# Patient Record
Sex: Female | Born: 1960 | Race: White | Hispanic: No | Marital: Married | Smoking: Never smoker
Health system: Southern US, Community
[De-identification: ages and names within clinical notes are randomized; demographics above are authoritative.]

## PROBLEM LIST (undated history)

## (undated) DIAGNOSIS — Z800 Family history of malignant neoplasm of digestive organs: Secondary | ICD-10-CM

## (undated) DIAGNOSIS — R112 Nausea with vomiting, unspecified: Secondary | ICD-10-CM

## (undated) DIAGNOSIS — N189 Chronic kidney disease, unspecified: Secondary | ICD-10-CM

## (undated) DIAGNOSIS — IMO0002 Reserved for concepts with insufficient information to code with codable children: Secondary | ICD-10-CM

## (undated) DIAGNOSIS — Z87442 Personal history of urinary calculi: Secondary | ICD-10-CM

## (undated) DIAGNOSIS — R87619 Unspecified abnormal cytological findings in specimens from cervix uteri: Secondary | ICD-10-CM

## (undated) DIAGNOSIS — Z8052 Family history of malignant neoplasm of bladder: Secondary | ICD-10-CM

## (undated) DIAGNOSIS — N952 Postmenopausal atrophic vaginitis: Secondary | ICD-10-CM

## (undated) DIAGNOSIS — Z9889 Other specified postprocedural states: Secondary | ICD-10-CM

## (undated) DIAGNOSIS — Z8041 Family history of malignant neoplasm of ovary: Secondary | ICD-10-CM

## (undated) DIAGNOSIS — M8580 Other specified disorders of bone density and structure, unspecified site: Secondary | ICD-10-CM

## (undated) DIAGNOSIS — Z803 Family history of malignant neoplasm of breast: Secondary | ICD-10-CM

## (undated) HISTORY — DX: Family history of malignant neoplasm of digestive organs: Z80.0

## (undated) HISTORY — DX: Postmenopausal atrophic vaginitis: N95.2

## (undated) HISTORY — PX: DIAGNOSTIC LAPAROSCOPY: SUR761

## (undated) HISTORY — DX: Personal history of urinary calculi: Z87.442

## (undated) HISTORY — DX: Family history of malignant neoplasm of breast: Z80.3

## (undated) HISTORY — DX: Reserved for concepts with insufficient information to code with codable children: IMO0002

## (undated) HISTORY — PX: OTHER SURGICAL HISTORY: SHX169

## (undated) HISTORY — DX: Unspecified abnormal cytological findings in specimens from cervix uteri: R87.619

## (undated) HISTORY — DX: Family history of malignant neoplasm of ovary: Z80.41

## (undated) HISTORY — PX: BREAST BIOPSY: SHX20

## (undated) HISTORY — DX: Family history of malignant neoplasm of bladder: Z80.52

## (undated) HISTORY — DX: Other specified disorders of bone density and structure, unspecified site: M85.80

---

## 1999-05-09 ENCOUNTER — Other Ambulatory Visit: Admission: RE | Admit: 1999-05-09 | Discharge: 1999-05-09 | Payer: Self-pay | Admitting: Obstetrics and Gynecology

## 1999-12-23 ENCOUNTER — Ambulatory Visit (HOSPITAL_COMMUNITY): Admission: RE | Admit: 1999-12-23 | Discharge: 1999-12-23 | Payer: Self-pay | Admitting: Urology

## 1999-12-23 ENCOUNTER — Encounter: Payer: Self-pay | Admitting: Urology

## 2000-05-25 ENCOUNTER — Encounter: Admission: RE | Admit: 2000-05-25 | Discharge: 2000-05-25 | Payer: Self-pay | Admitting: Urology

## 2000-05-25 ENCOUNTER — Encounter: Payer: Self-pay | Admitting: Urology

## 2000-12-30 ENCOUNTER — Other Ambulatory Visit: Admission: RE | Admit: 2000-12-30 | Discharge: 2000-12-30 | Payer: Self-pay | Admitting: Obstetrics and Gynecology

## 2003-01-03 ENCOUNTER — Other Ambulatory Visit: Admission: RE | Admit: 2003-01-03 | Discharge: 2003-01-03 | Payer: Self-pay | Admitting: Obstetrics and Gynecology

## 2003-07-17 ENCOUNTER — Other Ambulatory Visit: Admission: RE | Admit: 2003-07-17 | Discharge: 2003-07-17 | Payer: Self-pay | Admitting: Obstetrics and Gynecology

## 2004-02-01 ENCOUNTER — Other Ambulatory Visit: Admission: RE | Admit: 2004-02-01 | Discharge: 2004-02-01 | Payer: Self-pay | Admitting: Obstetrics and Gynecology

## 2004-08-15 ENCOUNTER — Other Ambulatory Visit: Admission: RE | Admit: 2004-08-15 | Discharge: 2004-08-15 | Payer: Self-pay | Admitting: Obstetrics and Gynecology

## 2004-11-15 ENCOUNTER — Encounter: Admission: RE | Admit: 2004-11-15 | Discharge: 2004-11-15 | Payer: Self-pay | Admitting: Obstetrics and Gynecology

## 2005-02-12 ENCOUNTER — Other Ambulatory Visit: Admission: RE | Admit: 2005-02-12 | Discharge: 2005-02-12 | Payer: Self-pay | Admitting: Obstetrics and Gynecology

## 2005-05-15 ENCOUNTER — Encounter: Admission: RE | Admit: 2005-05-15 | Discharge: 2005-05-15 | Payer: Self-pay | Admitting: Obstetrics and Gynecology

## 2005-11-11 ENCOUNTER — Encounter: Admission: RE | Admit: 2005-11-11 | Discharge: 2005-11-11 | Payer: Self-pay | Admitting: Obstetrics and Gynecology

## 2006-11-18 ENCOUNTER — Encounter: Admission: RE | Admit: 2006-11-18 | Discharge: 2006-11-18 | Payer: Self-pay | Admitting: Obstetrics and Gynecology

## 2007-04-21 ENCOUNTER — Ambulatory Visit: Payer: Self-pay | Admitting: Obstetrics & Gynecology

## 2007-04-21 ENCOUNTER — Encounter: Payer: Self-pay | Admitting: Obstetrics & Gynecology

## 2007-04-21 ENCOUNTER — Encounter: Admission: RE | Admit: 2007-04-21 | Discharge: 2007-04-21 | Payer: Self-pay | Admitting: Obstetrics & Gynecology

## 2007-06-02 ENCOUNTER — Ambulatory Visit: Payer: Self-pay | Admitting: Obstetrics & Gynecology

## 2007-11-22 ENCOUNTER — Encounter: Admission: RE | Admit: 2007-11-22 | Discharge: 2007-11-22 | Payer: Self-pay | Admitting: Obstetrics & Gynecology

## 2008-03-03 HISTORY — PX: OTHER SURGICAL HISTORY: SHX169

## 2008-05-24 ENCOUNTER — Ambulatory Visit: Payer: Self-pay | Admitting: Obstetrics & Gynecology

## 2008-05-24 ENCOUNTER — Encounter: Payer: Self-pay | Admitting: Obstetrics & Gynecology

## 2008-05-24 ENCOUNTER — Other Ambulatory Visit: Admission: RE | Admit: 2008-05-24 | Discharge: 2008-05-24 | Payer: Self-pay | Admitting: Obstetrics & Gynecology

## 2008-05-24 LAB — CONVERTED CEMR LAB
Hemoglobin: 15.1 g/dL — ABNORMAL HIGH (ref 12.0–15.0)
MCV: 90.5 fL (ref 78.0–100.0)
Platelets: 233 10*3/uL (ref 150–400)
RBC: 4.93 M/uL (ref 3.87–5.11)
Total CHOL/HDL Ratio: 2.5
Triglycerides: 64 mg/dL (ref <150)
WBC: 4.3 10*3/uL (ref 4.0–10.5)

## 2008-07-06 ENCOUNTER — Encounter: Admission: RE | Admit: 2008-07-06 | Discharge: 2008-07-06 | Payer: Self-pay | Admitting: Obstetrics & Gynecology

## 2008-07-12 ENCOUNTER — Ambulatory Visit: Payer: Self-pay | Admitting: Obstetrics & Gynecology

## 2008-07-13 ENCOUNTER — Encounter: Payer: Self-pay | Admitting: Obstetrics & Gynecology

## 2008-09-18 ENCOUNTER — Ambulatory Visit (HOSPITAL_COMMUNITY): Admission: RE | Admit: 2008-09-18 | Discharge: 2008-09-20 | Payer: Self-pay | Admitting: Urology

## 2008-09-21 ENCOUNTER — Observation Stay (HOSPITAL_COMMUNITY): Admission: RE | Admit: 2008-09-21 | Discharge: 2008-09-22 | Payer: Self-pay | Admitting: Urology

## 2008-11-27 ENCOUNTER — Encounter: Admission: RE | Admit: 2008-11-27 | Discharge: 2008-11-27 | Payer: Self-pay | Admitting: Obstetrics & Gynecology

## 2009-05-07 ENCOUNTER — Encounter: Admission: RE | Admit: 2009-05-07 | Discharge: 2009-05-07 | Payer: Self-pay | Admitting: Internal Medicine

## 2009-05-23 ENCOUNTER — Encounter: Admission: RE | Admit: 2009-05-23 | Discharge: 2009-05-23 | Payer: Self-pay | Admitting: Family Medicine

## 2009-06-11 LAB — HM PAP SMEAR

## 2009-06-20 ENCOUNTER — Ambulatory Visit: Payer: Self-pay | Admitting: Obstetrics & Gynecology

## 2009-06-20 LAB — CONVERTED CEMR LAB
Cholesterol: 211 mg/dL — ABNORMAL HIGH (ref 0–200)
HDL: 87 mg/dL (ref >39)

## 2009-12-04 ENCOUNTER — Encounter: Admission: RE | Admit: 2009-12-04 | Discharge: 2009-12-04 | Payer: Self-pay | Admitting: Obstetrics & Gynecology

## 2009-12-04 LAB — HM MAMMOGRAPHY: HM Mammogram: NORMAL

## 2010-03-25 ENCOUNTER — Encounter: Payer: Self-pay | Admitting: Obstetrics and Gynecology

## 2010-06-09 LAB — TYPE AND SCREEN
ABO/RH(D): O POS
Antibody Screen: NEGATIVE

## 2010-06-09 LAB — CBC
HCT: 34.8 % — ABNORMAL LOW (ref 36.0–46.0)
HCT: 38.3 % (ref 36.0–46.0)
Hemoglobin: 11.8 g/dL — ABNORMAL LOW (ref 12.0–15.0)
Hemoglobin: 12.9 g/dL (ref 12.0–15.0)
MCHC: 33.7 g/dL (ref 30.0–36.0)
MCHC: 33.8 g/dL (ref 30.0–36.0)
MCHC: 34.2 g/dL (ref 30.0–36.0)
MCV: 89.8 fL (ref 78.0–100.0)
MCV: 90.5 fL (ref 78.0–100.0)
Platelets: 187 10*3/uL (ref 150–400)
Platelets: 219 10*3/uL (ref 150–400)
Platelets: 223 10*3/uL (ref 150–400)
Platelets: 250 10*3/uL (ref 150–400)
RBC: 3.85 MIL/uL — ABNORMAL LOW (ref 3.87–5.11)
RBC: 4.27 MIL/uL (ref 3.87–5.11)
RBC: 4.92 MIL/uL (ref 3.87–5.11)
RDW: 12.8 % (ref 11.5–15.5)
RDW: 12.9 % (ref 11.5–15.5)
WBC: 9.5 10*3/uL (ref 4.0–10.5)

## 2010-06-09 LAB — ABO/RH: ABO/RH(D): O POS

## 2010-07-16 NOTE — Assessment & Plan Note (Signed)
NAMESABEEN, [name]                ACCOUNT NO.:  [id number]   MEDICAL RECORD NO.:  [id number]          PATIENT TYPE:  POB   LOCATION:  [facility] [place]         FACILITY:  [facility]   PHYSICIAN:  Elsie Lincoln, MD      DATE OF BIRTH:  [birth year]   DATE OF SERVICE:  06/02/2007                                  CLINIC NOTE   The patient is a 50 year old thin white female who is osteopenic whom we  placed on Fosamax. She is also still having painful intercourse on the  Vagifem. We will change her to Estrace because she is very atrophic and  possibly not absorbing the Vagifem. She has also a history of  endometrial hyperplasia that was treated many years ago by physicians  __________ ; however, since we are placing her on Estrace we will  protect her endometrium with 10 days of Provera per month. The patient  understands this. If she is not better after a month, we will  investigate other reasons for painful intercourse such as vulvodynia.  She wants her cholesterol, glucose, TSH, and CBC drawn today for her  yearly visit. She is fasting for this today. She is set up today for  mammogram. She is also to obtain her Pap smear. We also reviewed calcium  and vitamin D requirements, and she will be taking 1500 mg of calcium a  day through diet and supplements as well as 800 mg of vitamin D. The  patient is to come back in a month to see how she is doing. One sample  of Estrace was also given.           ______________________________  Elsie Lincoln, MD     KL/MEDQ  D:  06/02/2007  T:  06/02/2007  Job:  [id number]

## 2010-07-16 NOTE — Assessment & Plan Note (Signed)
[name], [name]                ACCOUNT NO.:  [id number]   MEDICAL RECORD NO.:  [id number]          PATIENT TYPE:  POB   LOCATION:  [facility] [place]         FACILITY:  [facility]   PHYSICIAN:  Allie Bossier, MD        DATE OF BIRTH:  08-01-1960   DATE OF SERVICE:  07/12/2008                                  CLINIC NOTE   [name] is a 50 year old married white G1, P1, who is here for her  followup of symptomatic vaginal atrophy and untreated osteopenia.  At  her last visit, I discussed multiple options of therapy.  She previously  was on Fosamax, but became scared of possible side effects, quit taking  it.  After discussion, she wanted to try the Prempro 0.625/2.5 mg on a  daily basis.  She has been having several occasions of bleeding for  approximately a week each of the last few months that she has been on  the Prempro and she is not happy with the prospect of continued  bleeding.  Initially, I discussed switching her to [facility], but again  she does not want to have a monthly period; therefore, after discussion,  I have given her prescription and 4 samples of Yaz birth control pills.  She is going to take these on a continuous basis.  She understands that  at some point, we should probably switch her back to a lower dose of  estrogen.  She does not smoke and has no particular risk factor for  taking the pill.  She will come back if there is a problem.  If not, I  will see her in a year for her annual.      Allie Bossier, MD     MCD/MEDQ  D:  07/12/2008  T:  07/12/2008  Job:  [id number]

## 2010-07-16 NOTE — Op Note (Signed)
[name], [name]                ACCOUNT NO.:  [id number]   MEDICAL RECORD NO.:  [id number]          PATIENT TYPE:  OIB   LOCATION:  0098                         FACILITY:  [facility]   PHYSICIAN:  Bertram Millard. Dahlstedt, M.D.DATE OF BIRTH:  August 22, 1960   DATE OF PROCEDURE:  DATE OF DISCHARGE:                               OPERATIVE REPORT   PREOPERATIVE DIAGNOSIS:  Left renal calculi, horseshoe kidney.   POSTOPERATIVE DIAGNOSIS:  Left renal calculi, horseshoe kidney.   PROCEDURE PERFORMED:  Second stage left percutaneous nephrostolithotomy.   SURGEON:  Dr. Marcine Matar.   RESIDENT:  Martin Majestic. Peggye Pitt, MD.   INTERVENTIONAL RADIOLOGY:  Dr. Drucilla Chalet.   ANESTHESIA:  General endotracheal.   SPECIMENS:  Stones sent with the family.   ESTIMATED BLOOD LOSS:  Minimal.   COMPLICATIONS:  None.   DRAINS:  18-French Council tip catheter as nephrostomy tube.   INDICATIONS FOR PROCEDURE:  Ms. [name] is a 50 year old Caucasian female  with a history of a horseshoe kidney.  She had large stone burden on her  left side.  She underwent a first stage percutaneous nephrostolithotomy  2 days ago and now presents for second stage procedure.   DESCRIPTION OF PROCEDURE IN DETAIL:  [name] was brought back to the  operating room, quickly identified via her wristband.  A preoperative  time-out was called to confirm correct patient, procedure and site.  After successful induction of general endotracheal anesthesia, a Foley  catheter was inserted transurethrally.  The patient was then positioned  prone on the operating room table and we took special care to protect  all of her bony prominences to avoid any undue pressure.   Nephrostomy tube as well as her nephroureteral stent were prepped into  the field and as her back was prepped and draped in the usual sterile  fashion surgeons were sterilely gowned and gloved.  Wire was then  advanced down the left nephroureteral catheter and nephroureteral  catheter was removed and passed off the field.  The patient's  nephrostomy tube was removed.  We then advanced the second SuperStiff  wire through a coaxial sheath.  One of these wires was used as a safety  wire, the other wire was used to insert the balloon dilator.  The  balloon dilator was advanced over the wire into appropriate position  under fluoroscopic guidance.  The balloon was then inflated and the  sheath was passed over the balloon, the balloon dilator was removed.   A rigid nephroscopy was performed.  The stone was easily visible.  The  stone was then fragmented into multiple pieces using the LithoClast  device.  Pieces were then retrieved using a grasping device.  The stone  fragments were collected in its entirety, the renal pelvis was closely  examined and all fragmented pieces were retrieved successfully.   A flexible cystoscope was then used to perform flexible nephroscopy.  We  examined the lower pole, interpolar and upper pole calyces.  There was  calix that appeared to have some retained stone material, however this  was likely posteriorly located and we were unable to  access this calix.  The remainder of the collecting system was cleared of any remaining  stone debris.  There was a clot in the renal pelvis that was removed at  the end of the case with a rigid nephroscope.   The sheath was then removed and an 18-French Council tip catheter was  inserted over one of the wires into the renal pelvis.  Nephrostogram was  performed and confirmed accurate positioning within the renal pelvis, 3  mL of sterile water was used to blow up the balloon of the nephrostomy  tube.  The nephrostomy tube was then secured to the skin using 0 silk.  A sterile dressing was applied and the procedure was terminated.   Please note Dr. Marcine Matar was present, available and  participated in all aspects of this patient's operation.   DISPOSITION:  The patient tolerated the procedure  well, was extubated  and transported to the PACU in stable condition.     ______________________________  Martin Majestic Peggye Pitt, M.D.      Bertram Millard. Dahlstedt, M.D.  Electronically Signed    KAR/MEDQ  D:  09/21/2008  T:  09/21/2008  Job:  [id number]

## 2010-07-16 NOTE — Assessment & Plan Note (Signed)
[name], BEG                ACCOUNT NO.:  [id number]   MEDICAL RECORD NO.:  [id number]          PATIENT TYPE:  POB   LOCATION:  [facility] [place]         FACILITY:  [facility]   PHYSICIAN:  Allie Bossier, MD        DATE OF BIRTH:  08/14/1960   DATE OF SERVICE:  06/20/2009                                  CLINIC NOTE   Ms. Lucado is a 50 year old married white gravida 1, para 1.  She also  has an adopted child.  The children live with her included an 27-year-old  daughter and a 64 year old son.  She comes in here for annual exam.  She  has no particular GYN complaints.  She still does have decreased libido.  Her herbalist doctor, Dr. Flonnie Hailstone, has increased her testosterone  and she is hopeful that will help her issue.  She is now on estradiol  0.3 mg of cream daily, but she is getting ready to lower the dose to 0.1  mg.  She is also on progesterone.   PAST MEDICAL HISTORY:  She has a history of simple hyperplasia in 2004,  menopause at 50 years of age, a history of low-grade dysplasia, but most  recently her Pap smear was normal in March 2010, borderline  hypertension, mildly elevated cholesterol, osteopenia, and a history of  kidney stones.   REVIEW OF SYSTEMS:  She has been married for 19 years.  Remainder of her  review of systems and questions are negative.  She had a bone density in  March of this year and her mammogram was September of last year.  Her  bone density reportedly is increasing density in the hips.   PAST SURGICAL HISTORY:  C-section, laparoscopy, D&C, hysteroscopy, and  hemorrhoid surgery.   FAMILY HISTORY:  Negative for breast and GYN cancer, but colon cancer is  present in her maternal grandfather.   ALLERGIES:  No latex allergies.  No known drug allergies.   MEDICATIONS:  As above plus she takes a medicine called Ossopan which is  a collection of calcium, magnesium, vitamin D.  She also takes  molybdenum every other day, Nutrient 950, multivitamin  daily, magnesium  citrate 100 mg daily.   PHYSICAL EXAMINATION:  GENERAL:  Well-nourished, very pleasant white  female, in no apparent distress.  VITAL SIGNS:  Height 5 feet 2 inches, weight 124, blood pressure 126/78,  pulse 62.  HEENT:  Normal.  HEART:  Regular rate and rhythm.  BREASTS:  Normal bilaterally.  ABDOMEN:  Benign.  No palpable hepatosplenomegaly.  GU:  External genitalia, no lesions.  Well estrogenized at this point.  Vagina, normal discharge.  Cervix, nulliparous and somewhat stenotic,  but no gross lesions.  Uterus, normal size and shape, retroverted,  nontender.  Adnexa, nontender.  No masses.   ASSESSMENT AND PLAN:  Annual exam.  Recommended annual mammograms and  self-breast exams monthly, self-vulvar exams monthly.  For general main  health maintenance, I am checking her fasting lipids today as well as a  fasting glucose.  I will see her back in a year or sooner as necessary.      Allie Bossier, MD  MCD/MEDQ  D:  06/20/2009  T:  06/20/2009  Job:  [id number]

## 2010-07-16 NOTE — Assessment & Plan Note (Signed)
[name], [name]                ACCOUNT NO.:  [id number]   MEDICAL RECORD NO.:  [id number]          PATIENT TYPE:  POB   LOCATION:  [facility] [place]         FACILITY:  [facility]   PHYSICIAN:  Elsie Lincoln, MD      DATE OF BIRTH:  [birth year]   DATE OF SERVICE:  04/21/2007                                  CLINIC NOTE   Patient is a 50 year old G-2, para 1-0-1-1.  Patient also does have an  adopted child from [place].  She has been menopausal for several years  with an [facility] in the 90s in Dr. Dennie Bible office last year.  The patient  comes to me for her routine exam.  Her main compliant is of vaginal  dryness and pain with intercourse.  It has only been happening since she  went in to menopause.  She has been offered Premarin cream in the past,  but has refused.  We talked today about Vagifem and how there is  decreased systemic absorption.  The patient does agree for this.   Of note in her past medical history, she has had multiple ASCUS and  LSILs Pap smears.  However, she has never had any biopsy proven  dysplasia and she has always been HPV negative.  She is in a monogamous  relationship with her husband and is low risk.  Less likely the ASCUS is  due to her atrophic vaginitis.  The patient also in 2004 had a D&C done  for increased bleeding and there was simple hyperplasia without atypia.  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  She  has not had any vaginal bleeding for the past several years since she  entered menopause.  The patient has no complaints of leakage of urine or  blood in her stools.   PAST MEDICAL HISTORY:  She has had kidney stones in the past.  Denies  all other major medical problems.   SURGERIES:  She has had a C-section for CPD and also a diagnostic  laparoscopy for adhesions, which were for infertility, which came back  negative, and also a D&C for abnormal uterine  bleeding, which came back  simple hyperplasia.   GYNECOLOGY HISTORY:  Abnormal Pap smears as described above.  No other  history of abnormal test results.  Last mammogram was in September of  2008, which was BI-RADS I.   OBSTETRICAL HISTORY:  She has had one C-section and one miscarriage, and  again she has an adopted child from [place].   FAMILY HISTORY:  Her mother has prediabetes.  Her dad has high blood  pressure.  Her grandfather and uncle on the same die of the family have  colon cancer.  She is not aware of any familial syndromes, but I think  it would be wise for her to ask her uncle if he has been tested for the  hereditary syndrome that causes colon cancer.   SOCIAL HISTORY:  No drinking, drugs or alcohol.  The patient drinks  three to four caffeinated beverages a week.  She lives at home with her  husband  and two children.   SYSTEM REVIEW:  Positive for nose dryness from the cold weather.  She  wears contacts and glasses.  She has occasional hot flashes that are not  bothersome, and pain with intercourse as described above.   MEDICATIONS:  Multivitamin and calcium with magnesium.   ALLERGIES:  Denies.   Temperature 98.6, pulse 68, blood pressure 142/77, weight 132, height 62  inches.  GENERAL:  Well nourished, well developed, no apparent distress.  HEENT:  Normocephalic.  Atraumatic.  THYROID:  No masses.  LUNGS:  Clear to auscultation bilaterally.  HEART:  Regular rate and rhythm.  No murmur.  BREASTS:  No masses.  Nontender.  No lymphadenopathy.  ABDOMEN:  Soft, nontender.  No organomegaly.  No hernia.  GENITALIA:  Tanner 5.  Small amount of redness on the perineum.  Patient  denies scratching this area.  Vagina, urethra, cervix are atrophic.  Uterus is small and anteverted, nontender.  Adnexa:  No masses,  nontender.  RECTAL/VAGINAL:  No masses, nontender.  No hemorrhoids.  Good support of  the pelvic organs.  EXTREMITIES:  Nontender.   ASSESSMENT/PLAN:  This  is a 50 year old female with well woman check.  1. Pap smear.  We will send for reflex HPV and ASCUS.  2. Mammogram due in September.  3. Patient needs fasting cholesterol and glucose as her mother has      prediabetes.  4. DEXA scan.  5. Vagifem prescription given.  6. Transvaginal ultrasound to look at endometrial stripe.  We cannot      do an endometrial biopsy in the office because her cervical os is      very atrophic and unable to even admit the Pap smear brush.  7. Increase calcium intake and vitamin D.  8. Borderline hypertension.  Will reassess when she comes back.  9. Return to clinic in six weeks.           ______________________________  Elsie Lincoln, MD     KL/MEDQ  D:  04/21/2007  T:  04/21/2007  Job:  [id number]

## 2010-07-16 NOTE — Op Note (Signed)
NAMEEUREKA, VALDES                ACCOUNT NO.:  [id number]   MEDICAL RECORD NO.:  [id number]          PATIENT TYPE:  OIB   LOCATION:  1423                         [facility]ACILITY:  [facility]   PHYSICIAN:  Bertram Millard. 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   PRINCIPAL PROCEDURE:  Left percutaneous nephrolithotomy.   SURGEON:  Dr. Retta Diones.   FIRST ASSISTANT:  Edward Qualia.   RADIOLOGIST:  Ruel Favors.   ANESTHESIA:  General endotracheal.   COMPLICATIONS:  None.   ESTIMATED BLOOD LOSS:  Minimal.   BRIEF HISTORY:  This 50 year old female is a long term patient of mine.  She recently presented with intermittent but long-term left flank pain.  She has a history of a horseshoe kidney and renal calculi.  Evaluation  revealed a hydronephrotic kidney on the left with one large stone and  several smaller stones in upper and lower poles.  Due to the patient's  abnormal anatomy and the size of these stones, it was recommended that  we proceed with percutaneous nephrolithotomy.  The risks and  complications of the procedure have been discussed at length with the  patient.  She understands these and desires to proceed.   DESCRIPTION OF PROCEDURE:  The patient had previously had two  percutaneous tubes placed by Dr. Miles Costain in radiology.  I marked her left  side, she received preoperative IV antibiotics, she was taken to the  operating room.  General endotracheal anesthesia was given.  The patient  was then placed in the prone position.  All extremities were padded  appropriately.  A Foley catheter had been placed.  The lower pole  nephrostomy tube tract was dilated, and a sheath was placed.  Dr. Miles Costain  performed this  procedure.  We then used the nephroscope to remove  smaller calculi and break the larger stones up and aspirate them.  The  renal pelvis was quite dilated.  After working for quite awhile, it was  evident that all of the stone burden from this area was removed.  The  upper pole access was dilated.  We were unable to get into the upper  pole calix with the two fairly large stones present.  We tried for some  time, but the tract would not dilate all the way to the calix.  Because  of this, we left the Kumpe catheter and, felt that it would be best to  perform a second look at a later date.  An 23 French Council tip  catheter was placed into the renal pelvis  through the lower pole access.  The balloon was filled, and this  catheter was sutured to the skin.  Dry sterile dressings were then  placed.   The patient tolerated the procedure well.  She was awakened and then  taken to the PACU in stable condition.  Bertram Millard. Dahlstedt, M.D.  Electronically Signed     SMD/MEDQ  D:  10/31/2008  T:  10/31/2008  Job:  [id number]

## 2010-07-16 NOTE — Assessment & Plan Note (Signed)
NAMEBREANAH, Gibson                ACCOUNT NO.:  [id number]   MEDICAL RECORD NO.:  [id number]          PATIENT TYPE:  POB   LOCATION:  [facility] [place]         FACILITY:  [facility]   PHYSICIAN:  Allie Bossier, MD        DATE OF BIRTH:  [birth year]   DATE OF SERVICE:  05/24/2008                                  CLINIC NOTE   Ms. [name] is a 50 year old married white gravida 1, para 1 who is  here for her annual exam.  Her main complaint today is that of vaginal  dryness and pain with intercourse.  She rarely has intercourse because  of this.  She has tried Vagifem in the past but did not think it worked,  so when she saw Dr. Penne Lash in April 2009, she was given a prescription  for Estrace cream with Provera 10 days a month to protect her  endometrium.  She says that she never has taken it at all and is still  in her medicine cabinet, her fear of this is uterine cancer.  I have  explained at length that the way the Estrace is prescribed with Provera  will not cause her uterine cancer.  She also quit taking her Fosamax for  DEXA documented osteopenia months ago.  She tells me that she has an  aunt that was on a bone density drug, but the aunt fell and broke her  hip and she is worried that the Fosamax will cause her hip fracture.  I  have tried to allay her fears about this.   PAST MEDICAL HISTORY:  History of recurrent ascus/low-grade dysplasia.  She has biopsy-proven CIN 1, mildly elevated cholesterol, and simple  hyperplasia on a D&C hysteroscopy in 2004.   PAST SURGICAL HISTORY:  1. D&C hysteroscopy.  2. C-section.  3. Laparoscopy.  4. Hemorrhoid surgery.   REVIEW OF SYSTEMS:  She had menopause at age 32.  Mammogram was done in  September 2009 and was normal.  She has been practicing as a Engineer, civil (consulting) in 14  years, but she does have an Charity fundraiser.  Another thing under review of systems  is that she has recurrent issues with her rectum.  She has seen a  Lawyer in [place] in the past  and her current question is could  her rectal tags turn into a rectal cancer.   MEDICATIONS:  She takes a multivitamin.  She also takes a separate  calcium with magnesium and vitamin D.   ALLERGIES:  No known drug allergies.  No latex allergies.   FAMILY HISTORY:  She denies breast and GYN cancer in her family but does  say that her grandfather had colon cancer and her father has colon  polyps that are described as pre cancer.   SOCIAL HISTORY:  Negative for tobacco, alcohol, or drug use.  She lives  at home with her 2 children (1 adopted from [place] and 1 biological).   PHYSICAL EXAMINATION:  VITAL SIGNS:  Weight 119 pounds, height 5 feet 2  inches, blood pressure 138/73, pulse 53.  HEENT:  Normal.  BREASTS:  Normal bilaterally.  ABDOMEN:  Scaphoid benign.  EXTERNAL GENITALIA:  Normal  mons.  On her left vulva, she has this 2-cm  line of what appears to be papillomatosis.  The cervical biopsy  instrument to biopsy some of this.  Her introitus is very small and very  atrophic.  I did use a pediatric speculum.  They were able to visualize  a nulliparous stenotic cervix and I obtained a Pap smear.  The remainder  of her vagina appears normal with the exception of the severe atrophy.  A bimanual exam reveals a retroverted minimally mobile uterus and  nonenlarged adnexa.   ASSESSMENT AND PLAN:  1. Annual exam.  I have checked Pap smear.  Recommended self-breast      and self-vulvar exams monthly.  2. Vulvar papillomatosis.  I have biopsied it, and she will come back      in a month to go over the results.  3. Symptomatic vaginal atrophy along with untreated osteopenia.  We      have discussed options at length and currently she will try the      systemic oral hormone replacement therapy in the form of Prempro      0.625/2.5 mg daily.  I have told her that she can also add some of      her Estrace, it is in her medicine cabinet to her vulva for quicker      relief of her atrophy.  I  have also ordered fasting glucose and      lipids today.  She will follow up in a month.      Allie Bossier, MD     MCD/MEDQ  D:  05/24/2008  T:  05/24/2008  Job:  [id number]

## 2010-09-17 ENCOUNTER — Encounter: Payer: Self-pay | Admitting: *Deleted

## 2010-09-17 ENCOUNTER — Encounter: Payer: Self-pay | Admitting: Obstetrics & Gynecology

## 2010-09-17 ENCOUNTER — Ambulatory Visit (INDEPENDENT_AMBULATORY_CARE_PROVIDER_SITE_OTHER): Payer: BC Managed Care – PPO | Admitting: Obstetrics & Gynecology

## 2010-09-17 ENCOUNTER — Other Ambulatory Visit (HOSPITAL_COMMUNITY)
Admission: RE | Admit: 2010-09-17 | Discharge: 2010-09-17 | Disposition: A | Discharge status: Home / Self Care | Payer: BC Managed Care – PPO | Source: Ambulatory Visit | Attending: Obstetrics & Gynecology | Admitting: Obstetrics & Gynecology

## 2010-09-17 VITALS — BP 113/66 | HR 68 | Temp 97.7°F | Resp 16 | Ht 62.0 in | Wt 128.0 lb

## 2010-09-17 DIAGNOSIS — Z113 Encounter for screening for infections with a predominantly sexual mode of transmission: Secondary | ICD-10-CM

## 2010-09-17 DIAGNOSIS — Z8742 Personal history of other diseases of the female genital tract: Secondary | ICD-10-CM | POA: Insufficient documentation

## 2010-09-17 DIAGNOSIS — N8501 Benign endometrial hyperplasia: Secondary | ICD-10-CM

## 2010-09-17 DIAGNOSIS — Z1159 Encounter for screening for other viral diseases: Secondary | ICD-10-CM | POA: Insufficient documentation

## 2010-09-17 DIAGNOSIS — Z01419 Encounter for gynecological examination (general) (routine) without abnormal findings: Secondary | ICD-10-CM

## 2010-09-17 DIAGNOSIS — Z124 Encounter for screening for malignant neoplasm of cervix: Secondary | ICD-10-CM | POA: Insufficient documentation

## 2010-09-17 DIAGNOSIS — Z1272 Encounter for screening for malignant neoplasm of vagina: Secondary | ICD-10-CM

## 2010-09-17 DIAGNOSIS — N8500 Endometrial hyperplasia, unspecified: Secondary | ICD-10-CM

## 2010-09-17 NOTE — Progress Notes (Signed)
Subjective:    Patient ID: [name], female    DOB: 31-Mar-1960, 50 y.o.   MRN: [id number]  Gynecologic Exam   [name] is a 50 year old female, who presents for her annual exam. The patient only complaint is breast asymmetry. The patient's right breast has been larger than her left breast for most of her life. However, she feels like her right breast is drooping more than her left breast. There is no skin retraction or nipple discharge, lumps or pain. The patient does not have a history of breast or ovarian cancer in her family. The patient uses a compounded medication estradiol and testosterone for her vaginal dryness. This helped better than Estrace alone. She also takes progesterone powder capsule to protect her endometrium. The patient has had a history of endometrial hyperplasia in 2004. Patient is menopausal and not having any systemic symptoms. She's not having any irregular bleeding. She has not had any bleeding for many years. The patient is here for annual exam. She needs a Pap smear annually a due to history of multiple abnormal Pap smears. She cannot remember if she's ever had freezing or burning of her cervix. I will err on the side of being conservative and doing annual Pap smears.    Review of Systems  Constitutional: Negative.   HENT: Negative.   Eyes: Negative.   Respiratory: Negative.   Cardiovascular: Negative.   Gastrointestinal: Negative.   Genitourinary: Negative.   Musculoskeletal: Negative.   Neurological: Negative.   Hematological: Negative.   Psychiatric/Behavioral: Negative.   Breast:  Pt thinks right breast is larger than left breast.  No lumps felt.  Breasts have been asymetric her whole life.     Objective:   Physical Exam  Constitutional: She is oriented to person, place, and time. She appears well-developed and well-nourished.  HENT:  Head: Normocephalic and atraumatic.  Eyes: Conjunctivae are normal.  Neck: No thyromegaly present.  Cardiovascular:  Normal rate and regular rhythm.   Pulmonary/Chest: Breath sounds normal.  Abdominal: Soft. She exhibits no distension and no mass. There is no tenderness. There is no rebound and no guarding.  Genitourinary: Rectum normal and uterus normal. No breast swelling, tenderness, discharge or bleeding. Right adnexum displays no mass, no tenderness and no fullness. Left adnexum displays no mass, no tenderness and no fullness.       Vagina is atrophic.  Uterus is anteverted and NT.  Right breast is larger than her left breast.  No skin retraction.  Nml variant.  Musculoskeletal: Normal range of motion.  Neurological: She is alert and oriented to person, place, and time.  Skin: Skin is warm and dry.  Psychiatric: She has a normal mood and affect. Her behavior is normal.          Assessment & Plan:  Assessment: 50 year old G1, P23, female, who is menopausal for her annual exam.  1. Pap smear done.  2.  Atrophic vaginitis:  We will prescribe her vaginal creams and progesterone. We will have the compounding pharmacy fax [place] the exact prescription as there is not an option and at the at this time in EPIC to order through e-prescribe.  3. Patient is up-to-date on her mammogram. She is due in October 2012.  4. Patient had her [facility] drawn at [facility] The patient states her cholesterol is normal. She does not want to be retested at this time.  5. The patient will need a colonoscopy at age 25.  5. Patient to return to the office in  one year or sooner if needed.

## 2010-09-17 NOTE — Patient Instructions (Addendum)
We will call your pharmacy to get the exact prescription to refill.  It is a specialized compounded drug that is not in our system.  Mervyn Gay will call you when we have filled the prescription correctly.

## 2010-09-18 ENCOUNTER — Ambulatory Visit: Payer: Self-pay | Admitting: Obstetrics & Gynecology

## 2010-12-16 ENCOUNTER — Other Ambulatory Visit: Payer: Self-pay | Admitting: Obstetrics & Gynecology

## 2010-12-16 DIAGNOSIS — Z1231 Encounter for screening mammogram for malignant neoplasm of breast: Secondary | ICD-10-CM

## 2011-01-14 ENCOUNTER — Ambulatory Visit
Admission: RE | Admit: 2011-01-14 | Discharge: 2011-01-14 | Disposition: A | Discharge status: Home / Self Care | Payer: BC Managed Care – PPO | Source: Ambulatory Visit | Attending: Obstetrics & Gynecology | Admitting: Obstetrics & Gynecology

## 2011-01-14 DIAGNOSIS — Z1231 Encounter for screening mammogram for malignant neoplasm of breast: Secondary | ICD-10-CM

## 2011-04-22 ENCOUNTER — Other Ambulatory Visit: Payer: Self-pay | Admitting: Obstetrics & Gynecology

## 2011-04-22 MED ORDER — MEDROXYPROGESTERONE ACETATE 2.5 MG PO TABS: 2.5000 mg | ORAL_TABLET | Freq: Every day | ORAL | Status: DC

## 2011-04-22 MED ORDER — ESTRADIOL 0.025 MG/24HR TD PTTW: 1.0000 | MEDICATED_PATCH | TRANSDERMAL | Status: DC

## 2011-08-27 ENCOUNTER — Ambulatory Visit: Payer: BC Managed Care – PPO | Admitting: Obstetrics & Gynecology

## 2011-09-23 ENCOUNTER — Ambulatory Visit (INDEPENDENT_AMBULATORY_CARE_PROVIDER_SITE_OTHER): Payer: BC Managed Care – PPO | Admitting: Obstetrics & Gynecology

## 2011-09-23 ENCOUNTER — Encounter: Payer: Self-pay | Admitting: Obstetrics & Gynecology

## 2011-09-23 ENCOUNTER — Telehealth: Payer: Self-pay | Admitting: *Deleted

## 2011-09-23 VITALS — BP 127/74 | HR 58 | Temp 98.6°F | Resp 16 | Ht 62.0 in | Wt 130.0 lb

## 2011-09-23 DIAGNOSIS — Z124 Encounter for screening for malignant neoplasm of cervix: Secondary | ICD-10-CM

## 2011-09-23 DIAGNOSIS — Z0000 Encounter for general adult medical examination without abnormal findings: Secondary | ICD-10-CM

## 2011-09-23 DIAGNOSIS — Z113 Encounter for screening for infections with a predominantly sexual mode of transmission: Secondary | ICD-10-CM

## 2011-09-23 MED ORDER — AMBULATORY NON FORMULARY MEDICATION: 50.0000 mg | Freq: Every day | Status: DC

## 2011-09-23 MED ORDER — ESTRADIOL 0.075 MG/24HR TD PTWK: 1.0000 | MEDICATED_PATCH | TRANSDERMAL | Status: DC

## 2011-09-23 NOTE — Telephone Encounter (Signed)
RF on progesterone SR 50mg .

## 2011-09-23 NOTE — Progress Notes (Addendum)
Subjective:    [name] is a 51 y.o. female who presents for an annual exam. The patient has no complaints today. She is tired of using syringes to place her vaginal HRT. The patient is sexually active. GYN screening history: last pap: was normal. The patient wears seatbelts: yes. The patient participates in regular exercise: yes. Has the patient ever been transfused or tattooed?: no. The patient reports that there is not domestic violence in her life.   Menstrual History: OB History    Grav Para Term Preterm Abortions TAB SAB Ect Mult Living   1 1 1       1       Menarche age: 89 No LMP recorded. Patient is postmenopausal.    The following portions of the patient's history were reviewed and updated as appropriate: allergies, current medications, past family history, past medical history, past social history, past surgical history and problem list.  Review of Systems A comprehensive review of systems was negative.    Objective:    BP 127/74  Pulse 58  Temp 98.6 F (37 C) (Oral)  Resp 16  Ht 5\' 2"  (1.575 m)  Wt 130 lb (58.968 kg)  BMI 23.78 kg/m2  General Appearance:    Alert, cooperative, no distress, appears stated age  Head:    Normocephalic, without obvious abnormality, atraumatic  Eyes:    PERRL, conjunctiva/corneas clear, EOM's intact, fundi    benign, both eyes  Ears:    Normal TM's and external ear canals, both ears  Nose:   Nares normal, septum midline, mucosa normal, no drainage    or sinus tenderness  Throat:   Lips, mucosa, and tongue normal; teeth and gums normal  Neck:   Supple, symmetrical, trachea midline, no adenopathy;    thyroid:  no enlargement/tenderness/nodules; no carotid   bruit or JVD  Back:     Symmetric, no curvature, ROM normal, no CVA tenderness  Lungs:     Clear to auscultation bilaterally, respirations unlabored  Chest Wall:    No tenderness or deformity   Heart:    Regular rate and rhythm, S1 and S2 normal, no murmur, rub   or gallop    Breast Exam:    No tenderness, masses, or nipple abnormality  Abdomen:     Soft, non-tender, bowel sounds active all four quadrants,    no masses, no organomegaly  Genitalia:    Normal female without lesion, discharge or tenderness, stenotic nulliparous cervix, no lesions, NSSA, NT, mobile, no adnexal masses or tenderness     Extremities:   Extremities normal, atraumatic, no cyanosis or edema  Pulses:   2+ and symmetric all extremities  Skin:   Skin color, texture, turgor normal, no rashes or lesions  Lymph nodes:   Cervical, supraclavicular, and axillary nodes normal  Neurologic:   CNII-XII intact, normal strength, sensation and reflexes    throughout  .    Assessment:    Healthy female exam.    Plan:     Mammogram. Pap smear.  She will schedule her colonoscopy. I will prescribe Climara patches 0.075mg  and if she doesn't like it, I will refill her compounded creams.

## 2011-09-25 ENCOUNTER — Ambulatory Visit: Payer: BC Managed Care – PPO | Admitting: Obstetrics & Gynecology

## 2012-01-20 ENCOUNTER — Ambulatory Visit (INDEPENDENT_AMBULATORY_CARE_PROVIDER_SITE_OTHER): Payer: BC Managed Care – PPO

## 2012-01-20 DIAGNOSIS — Z1231 Encounter for screening mammogram for malignant neoplasm of breast: Secondary | ICD-10-CM

## 2012-01-20 DIAGNOSIS — Z0000 Encounter for general adult medical examination without abnormal findings: Secondary | ICD-10-CM

## 2012-04-29 ENCOUNTER — Other Ambulatory Visit: Payer: Self-pay | Admitting: *Deleted

## 2012-04-29 ENCOUNTER — Telehealth: Payer: Self-pay | Admitting: *Deleted

## 2012-04-29 DIAGNOSIS — N952 Postmenopausal atrophic vaginitis: Secondary | ICD-10-CM

## 2012-04-29 MED ORDER — PROGESTERONE MICRONIZED 100 MG PO CAPS: 100.0000 mg | ORAL_CAPSULE | Freq: Every day | ORAL | Status: DC

## 2012-04-29 MED ORDER — OSPEMIFENE 60 MG PO TABS: 1.0000 | ORAL_TABLET | Freq: Every day | ORAL | Status: DC

## 2012-04-29 NOTE — Telephone Encounter (Signed)
Per Dr Marice Potter pt may start Osphena 60 mg daily and restart her Prometrium 100 mg daily.  Both RX's were sent to [facility]

## 2012-06-15 ENCOUNTER — Encounter: Payer: Self-pay | Admitting: Obstetrics & Gynecology

## 2012-09-28 ENCOUNTER — Encounter: Payer: Self-pay | Admitting: Obstetrics & Gynecology

## 2012-09-28 ENCOUNTER — Ambulatory Visit (INDEPENDENT_AMBULATORY_CARE_PROVIDER_SITE_OTHER): Payer: BC Managed Care – PPO | Admitting: Obstetrics & Gynecology

## 2012-09-28 VITALS — BP 137/79 | HR 64 | Resp 16 | Ht 62.0 in | Wt 133.0 lb

## 2012-09-28 DIAGNOSIS — Z1151 Encounter for screening for human papillomavirus (HPV): Secondary | ICD-10-CM

## 2012-09-28 DIAGNOSIS — Z01419 Encounter for gynecological examination (general) (routine) without abnormal findings: Secondary | ICD-10-CM

## 2012-09-28 DIAGNOSIS — Z0000 Encounter for general adult medical examination without abnormal findings: Secondary | ICD-10-CM

## 2012-09-28 DIAGNOSIS — Z124 Encounter for screening for malignant neoplasm of cervix: Secondary | ICD-10-CM

## 2012-09-28 MED ORDER — ESTRADIOL-LEVONORGESTREL 0.045-0.015 MG/DAY TD PTWK: 1.0000 | MEDICATED_PATCH | TRANSDERMAL | Status: DC

## 2012-09-28 NOTE — Progress Notes (Signed)
Subjective:    [name] is a 52 y.o. female who presents for an annual exam. The patient has no complaints today. She doesn't feel any better with regard to her vaginal dryness with Osphena and she would like to return to [employer]. The patient is sexually active. GYN screening history: last pap: was normal. The patient wears seatbelts: yes. The patient participates in regular exercise: yes. Has the patient ever been transfused or tattooed?: no. The patient reports that there is not domestic violence in her life.   Menstrual History: OB History   Grav Para Term Preterm Abortions TAB SAB Ect Mult Living   1 1 1       1       Menarche age: 52 [place]: 68  No LMP recorded. Patient is postmenopausal.    The following portions of the patient's history were reviewed and updated as appropriate: allergies, current medications, past family history, past medical history, past social history, past surgical history and problem list.  Review of Systems A comprehensive review of systems was negative.  She is married. She had her colonoscopy last year and reports that it was normal.   Objective:    BP 137/79  Pulse 64  Resp 16  Ht 5\' 2"  (1.575 m)  Wt 133 lb (60.328 kg)  BMI 24.32 kg/m2  General Appearance:    Alert, cooperative, no distress, appears stated age  Head:    Normocephalic, without obvious abnormality, atraumatic  Eyes:    PERRL, conjunctiva/corneas clear, EOM's intact, fundi    benign, both eyes  Ears:    Normal TM's and external ear canals, both ears  Nose:   Nares normal, septum midline, mucosa normal, no drainage    or sinus tenderness  Throat:   Lips, mucosa, and tongue normal; teeth and gums normal  Neck:   Supple, symmetrical, trachea midline, no adenopathy;    thyroid:  no enlargement/tenderness/nodules; no carotid   bruit or JVD  Back:     Symmetric, no curvature, ROM normal, no CVA tenderness  Lungs:     Clear to auscultation bilaterally, respirations unlabored   Chest Wall:    No tenderness or deformity   Heart:    Regular rate and rhythm, S1 and S2 normal, no murmur, rub   or gallop  Breast Exam:    No tenderness, masses, or nipple abnormality  Abdomen:     Soft, non-tender, bowel sounds active all four quadrants,    no masses, no organomegaly  Genitalia:    Normal female without lesion, discharge or tenderness, NSSA, NT, mobile, normal adnexal exam     Extremities:   Extremities normal, atraumatic, no cyanosis or edema  Pulses:   2+ and symmetric all extremities  Skin:   Skin color, texture, turgor normal, no rashes or lesions  Lymph nodes:   Cervical, supraclavicular, and axillary nodes normal  Neurologic:   CNII-XII intact, normal strength, sensation and reflexes    throughout  .    Assessment:    Healthy female exam.    Plan:     Mammogram. Thin prep Pap smear. with HPV cotesting Mammo in the Fall

## 2012-10-28 ENCOUNTER — Telehealth: Payer: Self-pay | Admitting: *Deleted

## 2012-10-28 DIAGNOSIS — Z780 Asymptomatic menopausal state: Secondary | ICD-10-CM

## 2012-10-28 MED ORDER — MEDROXYPROGESTERONE ACETATE 2.5 MG PO TABS: 2.5000 mg | ORAL_TABLET | Freq: Every day | ORAL | Status: DC

## 2012-10-28 NOTE — Telephone Encounter (Signed)
Pt called stating that Climarapro was expensive and was there something else cheaper.  She wants to see if Climara and Provera 2.5 mg would be any cheaper.  Sent RX into pharmacy for them to run and check which regimen is the cheapest.

## 2013-01-06 ENCOUNTER — Other Ambulatory Visit: Payer: Self-pay | Admitting: Obstetrics & Gynecology

## 2013-01-06 DIAGNOSIS — Z139 Encounter for screening, unspecified: Secondary | ICD-10-CM

## 2013-01-25 ENCOUNTER — Ambulatory Visit: Payer: BC Managed Care – PPO

## 2013-02-01 ENCOUNTER — Ambulatory Visit (INDEPENDENT_AMBULATORY_CARE_PROVIDER_SITE_OTHER): Payer: BC Managed Care – PPO

## 2013-02-01 DIAGNOSIS — Z1231 Encounter for screening mammogram for malignant neoplasm of breast: Secondary | ICD-10-CM

## 2013-02-01 DIAGNOSIS — Z139 Encounter for screening, unspecified: Secondary | ICD-10-CM

## 2013-03-12 ENCOUNTER — Other Ambulatory Visit: Payer: Self-pay | Admitting: Obstetrics & Gynecology

## 2013-03-17 ENCOUNTER — Other Ambulatory Visit: Payer: Self-pay | Admitting: *Deleted

## 2013-03-17 DIAGNOSIS — N951 Menopausal and female climacteric states: Secondary | ICD-10-CM

## 2013-03-17 MED ORDER — ESTRADIOL 0.075 MG/24HR TD PTWK: 0.0750 mg | MEDICATED_PATCH | TRANSDERMAL | Status: DC

## 2013-06-14 ENCOUNTER — Other Ambulatory Visit: Payer: Self-pay | Admitting: Obstetrics & Gynecology

## 2013-06-16 ENCOUNTER — Other Ambulatory Visit: Payer: Self-pay | Admitting: *Deleted

## 2013-06-16 DIAGNOSIS — N952 Postmenopausal atrophic vaginitis: Secondary | ICD-10-CM

## 2013-06-16 MED ORDER — PROGESTERONE MICRONIZED 100 MG PO CAPS: 100.0000 mg | ORAL_CAPSULE | Freq: Every day | ORAL | Status: DC

## 2013-06-16 NOTE — Telephone Encounter (Signed)
Pt requesting RF on Prometrium sent to [place]

## 2013-10-13 ENCOUNTER — Ambulatory Visit: Payer: BC Managed Care – PPO | Admitting: Obstetrics & Gynecology

## 2013-11-06 ENCOUNTER — Other Ambulatory Visit: Payer: Self-pay | Admitting: Obstetrics & Gynecology

## 2013-11-10 ENCOUNTER — Encounter: Payer: Self-pay | Admitting: Obstetrics & Gynecology

## 2013-11-10 ENCOUNTER — Ambulatory Visit (INDEPENDENT_AMBULATORY_CARE_PROVIDER_SITE_OTHER): Payer: BC Managed Care – PPO | Admitting: Obstetrics & Gynecology

## 2013-11-10 VITALS — BP 142/71 | HR 66 | Resp 16 | Ht 62.0 in | Wt 134.0 lb

## 2013-11-10 DIAGNOSIS — Z01419 Encounter for gynecological examination (general) (routine) without abnormal findings: Secondary | ICD-10-CM | POA: Diagnosis not present

## 2013-11-10 DIAGNOSIS — N6452 Nipple discharge: Secondary | ICD-10-CM

## 2013-11-10 DIAGNOSIS — N950 Postmenopausal bleeding: Secondary | ICD-10-CM | POA: Diagnosis not present

## 2013-11-10 DIAGNOSIS — Z0000 Encounter for general adult medical examination without abnormal findings: Secondary | ICD-10-CM

## 2013-11-10 DIAGNOSIS — N6459 Other signs and symptoms in breast: Secondary | ICD-10-CM

## 2013-11-10 MED ORDER — ESTRADIOL 10 MCG VA TABS: ORAL_TABLET | VAGINAL | Status: DC

## 2013-11-10 NOTE — Progress Notes (Signed)
  Subjective:    [name] is a 53 y.o. female who presents for an annual exam. The patient has no complaints today. The patient is sexually active. GYN screening history: last pap: was normal. The patient wears seatbelts: yes. The patient participates in regular exercise: yes. Has the patient ever been transfused or tattooed?: no. The patient reports that there is not domestic violence in her life.   Pt has been on HRT for vaginal dryness.  She began spotting 2 days ago (very light).  This has never happened before.  Pt has history of simple hyperplasia.  Will get TVUS and consider biopsy.  Menstrual History: OB History   Grav Para Term Preterm Abortions TAB SAB Ect Mult Living   1 1 1       1        No LMP recorded. Patient is postmenopausal.    The following portions of the patient's history were reviewed and updated as appropriate: allergies, current medications, past family history, past medical history, past social history, past surgical history and problem list.  Review of Systems Pertinent items are noted in HPI.    Objective:      Filed Vitals:   11/10/13 1552  BP: 142/71  Pulse: 66  Resp: 16  Height: 5\' 2"  (1.575 m)  Weight: 134 lb (60.782 kg)   Vitals:  WNL General appearance: alert, cooperative and no distress Head: Normocephalic, without obvious abnormality, atraumatic Eyes: negative Throat: lips, mucosa, and tongue normal; teeth and gums normal Lungs: clear to auscultation bilaterally Breasts: normal appearance, no masses or tenderness, No nipple retraction or dimpling, Some evidence of crust on nipples.  Pt admits to some breast discharge at times. Heart: regular rate and rhythm Abdomen: soft, non-tender; bowel sounds normal; no masses,  no organomegaly  Pelvic:  External Genitalia:  Tanner V, no lesion Urethra:  No prolapse Vagina:  Pale pink, normal rugae, scant old blood, no discharge, no lesion Cervix:  No CMT, no lesion Uterus:  Normal size and  contour, non tender Adnexa:  Normal, no masses, non tender Rectovaginal Septum:  Non tender, no masses  Extremities: no edema, redness or tenderness in the calves or thighs Skin: no lesions or rash Lymph nodes: Axillary adenopathy: none     .    Assessment:    Healthy female exam.    Plan:    Pap smear not due for atleast 2 more years. Mammogram this fall Vagifem after the postmenopausal bleeding has been fully investigated. If spotting continues then needs TVUS. CBC, CMP, TSH, colsterol Prolatin for nipple discharge.

## 2013-11-14 ENCOUNTER — Telehealth: Payer: Self-pay | Admitting: *Deleted

## 2013-11-14 DIAGNOSIS — N950 Postmenopausal bleeding: Secondary | ICD-10-CM

## 2013-11-14 NOTE — Telephone Encounter (Signed)
Entered u/s order per dr leggett,

## 2013-11-15 ENCOUNTER — Encounter: Payer: Self-pay | Admitting: Obstetrics & Gynecology

## 2013-11-15 DIAGNOSIS — N950 Postmenopausal bleeding: Secondary | ICD-10-CM | POA: Insufficient documentation

## 2013-11-25 ENCOUNTER — Ambulatory Visit (INDEPENDENT_AMBULATORY_CARE_PROVIDER_SITE_OTHER): Payer: BC Managed Care – PPO

## 2013-11-25 ENCOUNTER — Other Ambulatory Visit (INDEPENDENT_AMBULATORY_CARE_PROVIDER_SITE_OTHER): Payer: BC Managed Care – PPO | Admitting: *Deleted

## 2013-11-25 DIAGNOSIS — Z01419 Encounter for gynecological examination (general) (routine) without abnormal findings: Secondary | ICD-10-CM | POA: Diagnosis not present

## 2013-11-25 DIAGNOSIS — N950 Postmenopausal bleeding: Secondary | ICD-10-CM

## 2013-11-26 LAB — CBC
HEMATOCRIT: 45.9 % (ref 36.0–46.0)
HEMOGLOBIN: 15.1 g/dL — AB (ref 12.0–15.0)
MCH: 29.7 pg (ref 26.0–34.0)
MCHC: 32.9 g/dL (ref 30.0–36.0)
MCV: 90.4 fL (ref 78.0–100.0)
Platelets: 229 10*3/uL (ref 150–400)
RBC: 5.08 MIL/uL (ref 3.87–5.11)
RDW: 13.9 % (ref 11.5–15.5)
WBC: 4.4 10*3/uL (ref 4.0–10.5)

## 2013-11-26 LAB — COMPREHENSIVE METABOLIC PANEL
ALBUMIN: 4.2 g/dL (ref 3.5–5.2)
ALT: 11 U/L (ref 0–35)
AST: 17 U/L (ref 0–37)
Alkaline Phosphatase: 71 U/L (ref 39–117)
BUN: 21 mg/dL (ref 6–23)
CALCIUM: 8.9 mg/dL (ref 8.4–10.5)
CHLORIDE: 102 meq/L (ref 96–112)
CO2: 26 meq/L (ref 19–32)
CREATININE: 0.89 mg/dL (ref 0.50–1.10)
GLUCOSE: 74 mg/dL (ref 70–99)
POTASSIUM: 4 meq/L (ref 3.5–5.3)
Sodium: 139 mEq/L (ref 135–145)
Total Bilirubin: 0.9 mg/dL (ref 0.2–1.2)
Total Protein: 6.5 g/dL (ref 6.0–8.3)

## 2013-11-26 LAB — PROLACTIN: PROLACTIN: 5.5 ng/mL

## 2013-11-26 LAB — TSH: TSH: 2.197 u[IU]/mL (ref 0.350–4.500)

## 2013-11-28 ENCOUNTER — Encounter: Payer: Self-pay | Admitting: Obstetrics & Gynecology

## 2013-11-28 ENCOUNTER — Telehealth: Payer: Self-pay | Admitting: *Deleted

## 2013-11-28 NOTE — Telephone Encounter (Signed)
LM for pt to call office to discuss TVU and scheduled Endometrial biopsy.  She needs to sign a ROI to get past records from hyperplasia

## 2013-11-28 NOTE — Telephone Encounter (Signed)
Message copied by Asencion Islam on Mon Nov 28, 2013  2:37 PM ------      Message from: Guss Bunde      Created: Mon Nov 28, 2013  1:28 PM       Call the patient and let them know their lab results are normal.  Thanks!!             ------

## 2013-11-28 NOTE — Telephone Encounter (Signed)
Pt notified of normal labs.  Copy of labs mailed to pt's home address.

## 2013-11-29 ENCOUNTER — Encounter: Payer: Self-pay | Admitting: Obstetrics & Gynecology

## 2013-11-29 ENCOUNTER — Ambulatory Visit (INDEPENDENT_AMBULATORY_CARE_PROVIDER_SITE_OTHER): Payer: BC Managed Care – PPO | Admitting: Obstetrics & Gynecology

## 2013-11-29 VITALS — BP 139/70 | HR 66 | Resp 16 | Ht 62.0 in | Wt 134.0 lb

## 2013-11-29 DIAGNOSIS — N649 Disorder of breast, unspecified: Secondary | ICD-10-CM

## 2013-11-29 DIAGNOSIS — N950 Postmenopausal bleeding: Secondary | ICD-10-CM

## 2013-11-29 DIAGNOSIS — Z0000 Encounter for general adult medical examination without abnormal findings: Secondary | ICD-10-CM

## 2013-11-29 NOTE — Addendum Note (Signed)
Addended by: [name] on: 11/29/2013 03:49 PM   Modules accepted: Orders

## 2013-11-29 NOTE — Progress Notes (Signed)
   Subjective:    Patient ID: [name], female    DOB: 08-25-1960, 54 y.o.   MRN: [id number]  HPI  53 yo MW post menopausal lady here for results of gyn u/s for PMB and PRL for "crusty nipples". Both are normal.   Review of Systems     Objective:   Physical Exam        Assessment & Plan:  Reassurance given

## 2013-12-14 ENCOUNTER — Telehealth: Payer: Self-pay | Admitting: *Deleted

## 2013-12-14 DIAGNOSIS — Z01812 Encounter for preprocedural laboratory examination: Secondary | ICD-10-CM

## 2013-12-14 MED ORDER — MISOPROSTOL 200 MCG PO TABS: ORAL_TABLET | ORAL | Status: DC

## 2013-12-14 NOTE — Telephone Encounter (Signed)
Pt scheduled for Endometrial biopsy on 12/22/13 and RX sent to [facility] for Cytotec 600 mg to use the night prior to procedure.

## 2013-12-22 ENCOUNTER — Other Ambulatory Visit: Payer: BC Managed Care – PPO | Admitting: Obstetrics & Gynecology

## 2013-12-28 ENCOUNTER — Other Ambulatory Visit: Payer: Self-pay | Admitting: Obstetrics & Gynecology

## 2013-12-28 DIAGNOSIS — Z139 Encounter for screening, unspecified: Secondary | ICD-10-CM

## 2014-01-02 ENCOUNTER — Encounter: Payer: Self-pay | Admitting: Obstetrics & Gynecology

## 2014-01-23 ENCOUNTER — Other Ambulatory Visit: Payer: Self-pay | Admitting: *Deleted

## 2014-01-23 DIAGNOSIS — N952 Postmenopausal atrophic vaginitis: Secondary | ICD-10-CM

## 2014-01-23 MED ORDER — PROGESTERONE MICRONIZED 100 MG PO CAPS: 100.0000 mg | ORAL_CAPSULE | Freq: Every day | ORAL | Status: DC

## 2014-01-23 NOTE — Telephone Encounter (Signed)
RX sent to [place] for RF on Prometrium.

## 2014-01-24 ENCOUNTER — Ambulatory Visit (HOSPITAL_COMMUNITY)
Admission: RE | Admit: 2014-01-24 | Payer: BC Managed Care – PPO | Source: Ambulatory Visit | Admitting: Obstetrics & Gynecology

## 2014-01-24 ENCOUNTER — Encounter (HOSPITAL_COMMUNITY): Admission: RE | Payer: Self-pay | Source: Ambulatory Visit

## 2014-01-24 SURGERY — DILATATION AND CURETTAGE /HYSTEROSCOPY
Anesthesia: General

## 2014-01-25 ENCOUNTER — Other Ambulatory Visit: Payer: BC Managed Care – PPO

## 2014-01-25 LAB — LIPID PANEL
CHOLESTEROL: 179 mg/dL (ref 0–200)
HDL: 75 mg/dL (ref >39)
LDL CALC: 92 mg/dL (ref 0–99)
Total CHOL/HDL Ratio: 2.4 Ratio
Triglycerides: 61 mg/dL (ref <150)
VLDL: 12 mg/dL (ref 0–40)

## 2014-02-02 ENCOUNTER — Telehealth: Payer: Self-pay | Admitting: *Deleted

## 2014-02-02 DIAGNOSIS — Z780 Asymptomatic menopausal state: Secondary | ICD-10-CM

## 2014-02-02 NOTE — Telephone Encounter (Signed)
Pt called requesting an order for a BMD here in [place].  Pt is post menopausal on hormones and order was placed in the system.

## 2014-02-08 ENCOUNTER — Ambulatory Visit (INDEPENDENT_AMBULATORY_CARE_PROVIDER_SITE_OTHER): Payer: BC Managed Care – PPO

## 2014-02-08 DIAGNOSIS — Z1231 Encounter for screening mammogram for malignant neoplasm of breast: Secondary | ICD-10-CM

## 2014-02-08 DIAGNOSIS — Z139 Encounter for screening, unspecified: Secondary | ICD-10-CM

## 2014-02-21 ENCOUNTER — Encounter (HOSPITAL_COMMUNITY): Payer: Self-pay

## 2014-02-22 ENCOUNTER — Ambulatory Visit (INDEPENDENT_AMBULATORY_CARE_PROVIDER_SITE_OTHER): Payer: BC Managed Care – PPO

## 2014-02-22 ENCOUNTER — Ambulatory Visit: Payer: BC Managed Care – PPO

## 2014-02-22 DIAGNOSIS — M859 Disorder of bone density and structure, unspecified: Secondary | ICD-10-CM | POA: Diagnosis not present

## 2014-03-09 ENCOUNTER — Ambulatory Visit (HOSPITAL_COMMUNITY): Payer: BLUE CROSS/BLUE SHIELD | Admitting: Anesthesiology

## 2014-03-09 ENCOUNTER — Ambulatory Visit (HOSPITAL_COMMUNITY)
Admission: RE | Admit: 2014-03-09 | Discharge: 2014-03-09 | Disposition: A | Discharge status: Home / Self Care | Payer: BLUE CROSS/BLUE SHIELD | Source: Ambulatory Visit | Attending: Obstetrics & Gynecology | Admitting: Obstetrics & Gynecology

## 2014-03-09 ENCOUNTER — Encounter (HOSPITAL_COMMUNITY): Payer: Self-pay | Admitting: *Deleted

## 2014-03-09 ENCOUNTER — Encounter (HOSPITAL_COMMUNITY)
Admission: RE | Disposition: A | Discharge status: Home / Self Care | Payer: Self-pay | Source: Ambulatory Visit | Attending: Obstetrics & Gynecology

## 2014-03-09 DIAGNOSIS — Z87442 Personal history of urinary calculi: Secondary | ICD-10-CM | POA: Insufficient documentation

## 2014-03-09 DIAGNOSIS — N189 Chronic kidney disease, unspecified: Secondary | ICD-10-CM | POA: Diagnosis not present

## 2014-03-09 DIAGNOSIS — N950 Postmenopausal bleeding: Secondary | ICD-10-CM

## 2014-03-09 DIAGNOSIS — N840 Polyp of corpus uteri: Secondary | ICD-10-CM

## 2014-03-09 HISTORY — DX: Chronic kidney disease, unspecified: N18.9

## 2014-03-09 HISTORY — DX: Gilbert syndrome: E80.4

## 2014-03-09 HISTORY — DX: Nausea with vomiting, unspecified: R11.2

## 2014-03-09 HISTORY — DX: Other specified postprocedural states: Z98.890

## 2014-03-09 HISTORY — PX: HYSTEROSCOPY WITH D & C: SHX1775

## 2014-03-09 LAB — CBC
HEMATOCRIT: 43.6 % (ref 36.0–46.0)
Hemoglobin: 15.1 g/dL — ABNORMAL HIGH (ref 12.0–15.0)
MCH: 31 pg (ref 26.0–34.0)
MCHC: 34.6 g/dL (ref 30.0–36.0)
MCV: 89.5 fL (ref 78.0–100.0)
Platelets: 254 10*3/uL (ref 150–400)
RBC: 4.87 MIL/uL (ref 3.87–5.11)
RDW: 12.7 % (ref 11.5–15.5)
WBC: 7.8 10*3/uL (ref 4.0–10.5)

## 2014-03-09 SURGERY — DILATATION AND CURETTAGE /HYSTEROSCOPY
Anesthesia: General | Site: Vagina

## 2014-03-09 MED ORDER — MIDAZOLAM HCL 2 MG/2ML IJ SOLN
INTRAMUSCULAR | Status: AC
  Filled 2014-03-09: qty 2

## 2014-03-09 MED ORDER — BUPIVACAINE HCL (PF) 0.5 % IJ SOLN
INTRAMUSCULAR | Status: AC
  Filled 2014-03-09: qty 30

## 2014-03-09 MED ORDER — SCOPOLAMINE 1 MG/3DAYS TD PT72
MEDICATED_PATCH | TRANSDERMAL | Status: AC
  Administered 2014-03-09: 1.5 mg
  Filled 2014-03-09: qty 1

## 2014-03-09 MED ORDER — DEXAMETHASONE SODIUM PHOSPHATE 10 MG/ML IJ SOLN
INTRAMUSCULAR | Status: DC | PRN
  Administered 2014-03-09: 4 mg via INTRAVENOUS

## 2014-03-09 MED ORDER — LACTATED RINGERS IV SOLN
INTRAVENOUS | Status: DC
  Administered 2014-03-09: 12:00:00 via INTRAVENOUS

## 2014-03-09 MED ORDER — LIDOCAINE HCL (CARDIAC) 20 MG/ML IV SOLN
INTRAVENOUS | Status: AC
  Filled 2014-03-09: qty 5

## 2014-03-09 MED ORDER — FENTANYL CITRATE 0.05 MG/ML IJ SOLN
INTRAMUSCULAR | Status: DC | PRN
  Administered 2014-03-09 (×2): 50 ug via INTRAVENOUS

## 2014-03-09 MED ORDER — MEPERIDINE HCL 25 MG/ML IJ SOLN: 6.2500 mg | INTRAMUSCULAR | Status: DC | PRN

## 2014-03-09 MED ORDER — ACETAMINOPHEN 325 MG PO TABS: 325.0000 mg | ORAL_TABLET | ORAL | Status: DC | PRN

## 2014-03-09 MED ORDER — PROPOFOL 10 MG/ML IV BOLUS
INTRAVENOUS | Status: AC
  Filled 2014-03-09: qty 20

## 2014-03-09 MED ORDER — ONDANSETRON HCL 4 MG/2ML IJ SOLN
INTRAMUSCULAR | Status: DC | PRN
  Administered 2014-03-09: 4 mg via INTRAVENOUS

## 2014-03-09 MED ORDER — PROPOFOL 10 MG/ML IV BOLUS
INTRAVENOUS | Status: DC | PRN
  Administered 2014-03-09: 180 mg via INTRAVENOUS

## 2014-03-09 MED ORDER — PROPOFOL 10 MG/ML IV BOLUS: INTRAVENOUS | Status: DC | PRN

## 2014-03-09 MED ORDER — KETOROLAC TROMETHAMINE 30 MG/ML IJ SOLN
INTRAMUSCULAR | Status: AC
  Filled 2014-03-09: qty 1

## 2014-03-09 MED ORDER — SODIUM CHLORIDE 0.9 % IR SOLN
Status: DC | PRN
  Administered 2014-03-09: 3000 mL

## 2014-03-09 MED ORDER — IBUPROFEN 600 MG PO TABS: 600.0000 mg | ORAL_TABLET | Freq: Four times a day (QID) | ORAL | Status: DC | PRN

## 2014-03-09 MED ORDER — FENTANYL CITRATE 0.05 MG/ML IJ SOLN: 25.0000 ug | INTRAMUSCULAR | Status: DC | PRN

## 2014-03-09 MED ORDER — MIDAZOLAM HCL 2 MG/2ML IJ SOLN: 0.5000 mg | Freq: Once | INTRAMUSCULAR | Status: DC | PRN

## 2014-03-09 MED ORDER — ONDANSETRON HCL 4 MG/2ML IJ SOLN
INTRAMUSCULAR | Status: AC
  Filled 2014-03-09: qty 2

## 2014-03-09 MED ORDER — BUPIVACAINE HCL (PF) 0.5 % IJ SOLN
INTRAMUSCULAR | Status: DC | PRN
  Administered 2014-03-09 (×2): 10 mL

## 2014-03-09 MED ORDER — KETOROLAC TROMETHAMINE 30 MG/ML IJ SOLN: 30.0000 mg | Freq: Once | INTRAMUSCULAR | Status: DC | PRN

## 2014-03-09 MED ORDER — ACETAMINOPHEN 160 MG/5ML PO SOLN: 325.0000 mg | ORAL | Status: DC | PRN

## 2014-03-09 MED ORDER — FENTANYL CITRATE 0.05 MG/ML IJ SOLN
INTRAMUSCULAR | Status: AC
  Filled 2014-03-09: qty 5

## 2014-03-09 MED ORDER — LIDOCAINE HCL (CARDIAC) 20 MG/ML IV SOLN
INTRAVENOUS | Status: DC | PRN
  Administered 2014-03-09: 20 mg via INTRAVENOUS
  Administered 2014-03-09: 80 mg via INTRAVENOUS

## 2014-03-09 MED ORDER — MIDAZOLAM HCL 2 MG/2ML IJ SOLN
INTRAMUSCULAR | Status: DC | PRN
  Administered 2014-03-09: 1 mg via INTRAVENOUS

## 2014-03-09 MED ORDER — PROMETHAZINE HCL 25 MG/ML IJ SOLN: 6.2500 mg | INTRAMUSCULAR | Status: DC | PRN

## 2014-03-09 MED ORDER — KETOROLAC TROMETHAMINE 30 MG/ML IJ SOLN
INTRAMUSCULAR | Status: DC | PRN
  Administered 2014-03-09: 30 mg via INTRAVENOUS

## 2014-03-09 SURGICAL SUPPLY — 16 items
CANISTER SUCT 3000ML (MISCELLANEOUS) ×2 IMPLANT
CATH ROBINSON RED A/P 16FR (CATHETERS) ×2 IMPLANT
CLOTH BEACON ORANGE TIMEOUT ST (SAFETY) ×2 IMPLANT
CONTAINER PREFILL 10% NBF 60ML (FORM) ×4 IMPLANT
DILATOR CANAL MILEX (MISCELLANEOUS) IMPLANT
ELECT REM PT RETURN 9FT ADLT (ELECTROSURGICAL)
ELECTRODE REM PT RTRN 9FT ADLT (ELECTROSURGICAL) IMPLANT
GLOVE BIO SURGEON STRL SZ 6.5 (GLOVE) ×2 IMPLANT
GOWN STRL REUS W/TWL LRG LVL3 (GOWN DISPOSABLE) ×4 IMPLANT
LOOP ANGLED CUTTING 22FR (CUTTING LOOP) IMPLANT
PACK VAGINAL MINOR WOMEN LF (CUSTOM PROCEDURE TRAY) ×2 IMPLANT
PAD OB MATERNITY 4.3X12.25 (PERSONAL CARE ITEMS) ×2 IMPLANT
TOWEL OR 17X24 6PK STRL BLUE (TOWEL DISPOSABLE) ×4 IMPLANT
TUBING AQUILEX INFLOW (TUBING) ×2 IMPLANT
TUBING AQUILEX OUTFLOW (TUBING) ×2 IMPLANT
WATER STERILE IRR 1000ML POUR (IV SOLUTION) ×2 IMPLANT

## 2014-03-09 NOTE — Anesthesia Preprocedure Evaluation (Addendum)
Anesthesia Evaluation  Patient identified by MRN, date of birth, ID band Patient awake    Reviewed: Allergy & Precautions, H&P , Patient's Chart, lab work & pertinent test results, reviewed documented beta blocker date and time   History of Anesthesia Complications (+) PONVNegative for: history of anesthetic complications  Airway Mallampati: II  TM Distance: >3 FB Neck ROM: full    Dental   Pulmonary  breath sounds clear to auscultation        Cardiovascular Exercise Tolerance: Good Rhythm:regular Rate:Normal     Neuro/Psych negative psych ROS   GI/Hepatic   Endo/Other    Renal/GU      Musculoskeletal   Abdominal   Peds  Hematology   Anesthesia Other Findings   Reproductive/Obstetrics                            Anesthesia Physical Anesthesia Plan  ASA: I  Anesthesia Plan: General LMA   Post-op Pain Management:    Induction:   Airway Management Planned:   Additional Equipment:   Intra-op Plan:   Post-operative Plan:   Informed Consent: I have reviewed the patients History and Physical, chart, labs and discussed the procedure including the risks, benefits and alternatives for the proposed anesthesia with the patient or authorized representative who has indicated his/her understanding and acceptance.   Dental Advisory Given  Plan Discussed with: CRNA, Surgeon and Anesthesiologist  Anesthesia Plan Comments:        Anesthesia Quick Evaluation

## 2014-03-09 NOTE — Transfer of Care (Signed)
Immediate Anesthesia Transfer of Care Note  Patient: [name]  Procedure(s) Performed: Procedure(s): DILATATION AND CURETTAGE /HYSTEROSCOPY (N/A)  Patient Location: PACU  Anesthesia Type:General  Level of Consciousness: awake, alert , oriented and patient cooperative  Airway & Oxygen Therapy: Patient Spontanous Breathing and Patient connected to nasal cannula oxygen  Post-op Assessment: Report given to PACU RN and Post -op Vital signs reviewed and stable  Post vital signs: Reviewed and stable  Complications: No apparent anesthesia complications

## 2014-03-09 NOTE — Op Note (Signed)
03/09/2014  1:49 PM  PATIENT:  [name]  54 y.o. female  PRE-OPERATIVE DIAGNOSIS:  Postmenopausal bleeding  POST-OPERATIVE DIAGNOSIS:  Postmenopausal bleeding + uterine polyp  PROCEDURE:  Procedure(s): DILATATION AND CURETTAGE /HYSTEROSCOPY (N/A) RESECTION OF UTERINE POLYP  SURGEON:  Surgeon(s) and Role:    * [name], MD - Primary    ANESTHESIA:   general  EBL:  Total I/O In: 800 [I.V.:800] Out: 100 [Urine:100]  BLOOD ADMINISTERED:none  DRAINS: none   LOCAL MEDICATIONS USED:  MARCAINE     SPECIMEN:  Source of Specimen:  uterine polyps  DISPOSITION OF SPECIMEN:  PATHOLOGY  COUNTS:  YES  TOURNIQUET:  * No tourniquets in log *  DICTATION: .Dragon Dictation  PLAN OF CARE: Discharge to home after PACU  PATIENT DISPOSITION:  PACU - hemodynamically stable.   Delay start of Pharmacological VTE agent (>24hrs) due to surgical blood loss or risk of bleeding: not applicable    The risks, benefits, and alternatives of surgery were explained, understood, and accepted. All questions were answered. Consents were signed. In the operating room LMA anesthesia was applied without complication, and she was placed in the dorsal lithotomy position. Her vagina was prepped and draped in the usual sterile fashion. A Robinson catheter was used to drain her bladder. A bimanual exam revealed a very small , anteverted mobile uterus. Her adnexa were nonenlarged. A speculum was placed and a single-tooth tenaculum was used to grasp the anterior lip of her cervix. A total of 20 mL of 0.5% Marcaine was used to perform a paracervical block. Her uterus sounded to 7 cm. Her cervix was carefully and slowly dilated to accommodate a hysteroscope. Hysteroscopy revealed an atrophic endometrium and 2 small polyps near her left ostia. I dilated her cervix further to accommodate a resectoscope. The distension media was changed to glycine from saline. I then resected both small polyps.  A curettage was done  in all quadrants and the fundus of the uterus.  A gritty sensation was appreciated throughout. There was no bleeding noted at the end of the case. She was taken to the recovery room after being extubated. She tolerated the procedure well.

## 2014-03-09 NOTE — Discharge Instructions (Signed)
Dilation and Curettage or Vacuum Curettage, Care After  Refer to this sheet in the next few weeks. These instructions provide you with information on caring for yourself after your procedure. Your health care provider may also give you more specific instructions. Your treatment has been planned according to current medical practices, but problems sometimes occur. Call your health care provider if you have any problems or questions after your procedure.  WHAT TO EXPECT AFTER THE PROCEDURE  After your procedure, it is typical to have light cramping and bleeding. This may last for 2 days to 2 weeks after the procedure.  HOME CARE INSTRUCTIONS   · Do not drive for 24 hours.  · Wait 1 week before returning to strenuous activities.  · Take your temperature 2 times a day for 4 days and write it down. Provide these temperatures to your health care provider if you develop a fever.  · Avoid long periods of standing.  · Avoid heavy lifting, pushing, or pulling. Do not lift anything heavier than 10 pounds (4.5 kg).  · Limit stair climbing to once or twice a day.  · Take rest periods often.  · You may resume your usual diet.  · Drink enough fluids to keep your urine clear or pale yellow.  · Your usual bowel function should return. If you have constipation, you may:  ¨ Take a mild laxative with permission from your health care provider.  ¨ Add fruit and bran to your diet.  ¨ Drink more fluids.  · Take showers instead of baths until your health care provider gives you permission to take baths.  · Do not go swimming or use a hot tub until your health care provider approves.  · Try to have someone with you or available to you the first 24-48 hours, especially if you were given a general anesthetic.  · Do not douche, use tampons, or have sex (intercourse) for 2 weeks after the procedure.  · Only take over-the-counter or prescription medicines as directed by your health care provider. Do not take aspirin. It can cause  bleeding.  · Follow up with your health care provider as directed.  SEEK MEDICAL CARE IF:   · You have increasing cramps or pain that is not relieved with medicine.  · You have abdominal pain that does not seem to be related to the same area of earlier cramping and pain.  · You have bad smelling vaginal discharge.  · You have a rash.  · You are having problems with any medicine.  SEEK IMMEDIATE MEDICAL CARE IF:   · You have bleeding that is heavier than a normal menstrual period.  · You have a fever.  · You have chest pain.  · You have shortness of breath.  · You feel dizzy or feel like fainting.  · You pass out.  · You have pain in your shoulder strap area.  · You have heavy vaginal bleeding with or without blood clots.  MAKE SURE YOU:   · Understand these instructions.  · Will watch your condition.  · Will get help right away if you are not doing well or get worse.  Document Released: 02/15/2000 Document Revised: 02/22/2013 Document Reviewed: 09/16/2012  ExitCare® Patient Information ©2015 [employer]. This information is not intended to replace advice given to you by your health care provider. Make sure you discuss any questions you have with your health care provider.

## 2014-03-09 NOTE — Anesthesia Procedure Notes (Signed)
Procedure Name: LMA Insertion Date/Time: 03/09/2014 1:00 PM Performed by: Stacie Glaze C Patient Re-evaluated:Patient Re-evaluated prior to inductionOxygen Delivery Method: Circle system utilized and Simple face mask Preoxygenation: Pre-oxygenation with 100% oxygen Intubation Type: IV induction Ventilation: Mask ventilation without difficulty LMA: LMA inserted LMA Size: 4.0 Grade View: Grade II Placement Confirmation: breath sounds checked- equal and bilateral and positive ETCO2 Dental Injury: Teeth and Oropharynx as per pre-operative assessment

## 2014-03-09 NOTE — H&P (Signed)
[name] is an 54 y.o. female. She has had some PMB off and on over the last few months. She was offered an [facility] but she prefers a d&c with hysteroscopy.   No LMP recorded. Patient is postmenopausal.    Past Medical History  Diagnosis Date  . History of kidney stones   . Infertility     Seen by RE @ [facility]  . Abnormal Pap smear     ASCUS/LGSIL  . Vaginitis, atrophic   . PONV (postoperative nausea and vomiting)   . 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 Gilbert's syndrome     pt denies problems    Past Surgical History  Procedure Laterality Date  . Cesarean section  12/17/1993    [facility]  . Kidney stone removal  2010  . Diagnostic laparoscopy      Family History  Problem Relation Age of Onset  . Hypertension Father   . Diabetes Mother     Social History:  reports that she has never smoked. She has never used smokeless tobacco. She reports that she does not drink alcohol or use illicit drugs.  Allergies: No Known Allergies  No prescriptions prior to admission    ROS  Height 5\' 2"  (1.575 m), weight 135 lb (61.236 kg). Physical Exam  Heart- rrr Lungs- CTAB Abd- benign  No results found for this or any previous visit (from the past 24 hour(s)).  No results found.  Assessment/Plan: PMB- she opts for d&c/h/s. Risks and alternatives of surgery explained.  Adrion Menz C. 03/09/2014, 8:31 AM

## 2014-03-10 ENCOUNTER — Encounter (HOSPITAL_COMMUNITY): Payer: Self-pay | Admitting: Obstetrics & Gynecology

## 2014-03-10 NOTE — Anesthesia Postprocedure Evaluation (Signed)
Anesthesia Post Note  Patient: [name]  Procedure(s) Performed: Procedure(s) (LRB): DILATATION AND CURETTAGE /HYSTEROSCOPY (N/A)  Anesthesia type: GA  Patient location: PACU  Post pain: Pain level controlled  Post assessment: Post-op Vital signs reviewed  Last Vitals:  Filed Vitals:   03/09/14 1500  BP: 131/68  Pulse: 57  Temp: 36.4 C  Resp: 22    Post vital signs: Reviewed  Level of consciousness: sedated  Complications: No apparent anesthesia complications

## 2014-04-20 ENCOUNTER — Ambulatory Visit (INDEPENDENT_AMBULATORY_CARE_PROVIDER_SITE_OTHER): Payer: BLUE CROSS/BLUE SHIELD | Admitting: Obstetrics & Gynecology

## 2014-04-20 ENCOUNTER — Encounter: Payer: Self-pay | Admitting: Obstetrics & Gynecology

## 2014-04-20 VITALS — Ht 62.0 in | Wt 135.0 lb

## 2014-04-20 DIAGNOSIS — Z09 Encounter for follow-up examination after completed treatment for conditions other than malignant neoplasm: Secondary | ICD-10-CM | POA: Diagnosis not present

## 2014-04-20 DIAGNOSIS — Z9889 Other specified postprocedural states: Secondary | ICD-10-CM | POA: Diagnosis not present

## 2014-04-20 MED ORDER — MEDROXYPROGESTERONE ACETATE 2.5 MG PO TABS: 2.5000 mg | ORAL_TABLET | Freq: Every day | ORAL | Status: DC

## 2014-04-20 MED ORDER — OSPEMIFENE 60 MG PO TABS: 1.0000 | ORAL_TABLET | Freq: Every morning | ORAL | Status: DC

## 2014-04-20 NOTE — Progress Notes (Signed)
   Subjective:    Patient ID: [name], female    DOB: 1960-08-14, 54 y.o.   MRN: [id number]  HPI  54 yo lady here for her 6 week post op visit after having a d&c and hysteroscopy for PMB. She had uterine polyps removed with the resectoscope and her pathology was negative. She didn't even need IBU post op. Bled for about 3 days lightly post op. She would like to stop vagifem and return to [place].  Review of Systems     Objective:   Physical Exam N/A       Assessment & Plan:  Post op - doing well Add osphena and provera 2.5 mg daily

## 2014-08-01 ENCOUNTER — Telehealth: Payer: Self-pay | Admitting: *Deleted

## 2014-08-01 DIAGNOSIS — N951 Menopausal and female climacteric states: Secondary | ICD-10-CM

## 2014-08-01 MED ORDER — ESTRADIOL 10 MCG VA TABS: ORAL_TABLET | VAGINAL | Status: DC

## 2014-08-01 NOTE — Telephone Encounter (Signed)
Pt called wanting to stop Osphenia and restart Estradiol vaginally because according to her it works better.

## 2015-01-24 ENCOUNTER — Other Ambulatory Visit (HOSPITAL_COMMUNITY): Payer: Self-pay | Admitting: Obstetrics & Gynecology

## 2015-01-24 DIAGNOSIS — Z1231 Encounter for screening mammogram for malignant neoplasm of breast: Secondary | ICD-10-CM

## 2015-02-14 ENCOUNTER — Ambulatory Visit: Payer: BLUE CROSS/BLUE SHIELD

## 2015-02-21 ENCOUNTER — Ambulatory Visit: Payer: BLUE CROSS/BLUE SHIELD

## 2015-02-22 ENCOUNTER — Encounter: Payer: Self-pay | Admitting: Obstetrics & Gynecology

## 2015-02-22 ENCOUNTER — Ambulatory Visit (INDEPENDENT_AMBULATORY_CARE_PROVIDER_SITE_OTHER): Payer: BLUE CROSS/BLUE SHIELD | Admitting: Obstetrics & Gynecology

## 2015-02-22 VITALS — BP 138/89 | HR 74 | Resp 16 | Ht 62.0 in | Wt 137.0 lb

## 2015-02-22 DIAGNOSIS — Z1151 Encounter for screening for human papillomavirus (HPV): Secondary | ICD-10-CM

## 2015-02-22 DIAGNOSIS — Z124 Encounter for screening for malignant neoplasm of cervix: Secondary | ICD-10-CM

## 2015-02-22 DIAGNOSIS — Z01419 Encounter for gynecological examination (general) (routine) without abnormal findings: Secondary | ICD-10-CM | POA: Diagnosis not present

## 2015-02-22 DIAGNOSIS — Z0000 Encounter for general adult medical examination without abnormal findings: Secondary | ICD-10-CM

## 2015-02-22 DIAGNOSIS — N941 Unspecified dyspareunia: Secondary | ICD-10-CM

## 2015-02-22 NOTE — Progress Notes (Signed)
Subjective:    [name] is a 54 y.o. MW P1 (21 son and 44 yo adopted daughter)  female who presents for an annual exam. The patient has no complaints today except her pain with sex for years. She uses vagifem, using lube. The patient is sexually active. GYN screening history: last pap: was normal. The patient wears seatbelts: yes. The patient participates in regular exercise: yes. Has the patient ever been transfused or tattooed?: no. The patient reports that there is not domestic violence in her life.   Menstrual History: OB History    Gravida Para Term Preterm AB TAB SAB Ectopic Multiple Living   1 1 1       1       Menarche age: 30  No LMP recorded. Patient is postmenopausal.    The following portions of the patient's history were reviewed and updated as appropriate: allergies, current medications, past family history, past medical history, past social history, past surgical history and problem list.  Review of Systems Pertinent items noted in HPI and remainder of comprehensive ROS otherwise negative.    Mammogram next week. Flu vaccine given already. Colonsocopy about 2014, due in 10 years.   Objective:    BP 138/89 mmHg  Pulse 74  Resp 16  Ht 5\' 2"  (1.575 m)  Wt 137 lb (62.143 kg)  BMI 25.05 kg/m2  General Appearance:    Alert, cooperative, no distress, appears stated age  Head:    Normocephalic, without obvious abnormality, atraumatic  Eyes:    PERRL, conjunctiva/corneas clear, EOM's intact, fundi    benign, both eyes  Ears:    Normal TM's and external ear canals, both ears  Nose:   Nares normal, septum midline, mucosa normal, no drainage    or sinus tenderness  Throat:   Lips, mucosa, and tongue normal; teeth and gums normal  Neck:   Supple, symmetrical, trachea midline, no adenopathy;    thyroid:  no enlargement/tenderness/nodules; no carotid   bruit or JVD  Back:     Symmetric, no curvature, ROM normal, no CVA tenderness  Lungs:     Clear to auscultation  bilaterally, respirations unlabored  Chest Wall:    No tenderness or deformity   Heart:    Regular rate and rhythm, S1 and S2 normal, no murmur, rub   or gallop  Breast Exam:    No tenderness, masses, or nipple abnormality  Abdomen:     Soft, non-tender, bowel sounds active all four quadrants,    no masses, no organomegaly  Genitalia:    Normal female without lesion, discharge or tenderness, NSSA, NT, mobile, normal adnexa, very small vagina but no point tenderness with Q tip     Extremities:   Extremities normal, atraumatic, no cyanosis or edema  Pulses:   2+ and symmetric all extremities  Skin:   Skin color, texture, turgor normal, no rashes or lesions  Lymph nodes:   Cervical, supraclavicular, and axillary nodes normal  Neurologic:   CNII-XII intact, normal strength, sensation and reflexes    throughout  .    Assessment:    Healthy female exam.   dyspareunia   Plan:     Breast self exam technique reviewed and patient encouraged to perform self-exam monthly. Thin prep Pap smear. with cotesting Suggested gently dilating vagina Awakenings sex therapy number given

## 2015-02-27 LAB — CYTOLOGY - PAP

## 2015-03-02 ENCOUNTER — Ambulatory Visit (INDEPENDENT_AMBULATORY_CARE_PROVIDER_SITE_OTHER): Payer: BLUE CROSS/BLUE SHIELD

## 2015-03-02 DIAGNOSIS — Z1231 Encounter for screening mammogram for malignant neoplasm of breast: Secondary | ICD-10-CM

## 2016-02-07 ENCOUNTER — Other Ambulatory Visit (HOSPITAL_COMMUNITY): Payer: Self-pay | Admitting: Obstetrics & Gynecology

## 2016-02-07 DIAGNOSIS — Z1231 Encounter for screening mammogram for malignant neoplasm of breast: Secondary | ICD-10-CM

## 2016-03-04 ENCOUNTER — Ambulatory Visit (INDEPENDENT_AMBULATORY_CARE_PROVIDER_SITE_OTHER): Payer: BLUE CROSS/BLUE SHIELD

## 2016-03-04 DIAGNOSIS — Z1231 Encounter for screening mammogram for malignant neoplasm of breast: Secondary | ICD-10-CM

## 2016-03-10 ENCOUNTER — Encounter: Payer: Self-pay | Admitting: Obstetrics & Gynecology

## 2016-03-10 ENCOUNTER — Ambulatory Visit (INDEPENDENT_AMBULATORY_CARE_PROVIDER_SITE_OTHER): Payer: BLUE CROSS/BLUE SHIELD | Admitting: Obstetrics & Gynecology

## 2016-03-10 VITALS — BP 144/83 | HR 73 | Ht 62.0 in | Wt 131.0 lb

## 2016-03-10 DIAGNOSIS — Z01419 Encounter for gynecological examination (general) (routine) without abnormal findings: Secondary | ICD-10-CM | POA: Diagnosis not present

## 2016-03-10 DIAGNOSIS — Z0000 Encounter for general adult medical examination without abnormal findings: Secondary | ICD-10-CM

## 2016-03-10 MED ORDER — OSPEMIFENE 60 MG PO TABS: 1.0000 | ORAL_TABLET | Freq: Every day | ORAL | 12 refills | Status: DC

## 2016-03-10 NOTE — Progress Notes (Signed)
Subjective:    [name] is a 56 y.o. MW P1 (+ adopted daughter)female who presents for an annual exam. The patient has no complaints today except continued dyspareunia. She tried Vagifem in the past, also had the [name] procedure but not much change.  The patient is sexually active. GYN screening history: last pap: was normal. The patient wears seatbelts: yes. The patient participates in regular exercise: yes. Has the patient ever been transfused or tattooed?: no. The patient reports that there is not domestic violence in her life.   Menstrual History: OB History    Gravida Para Term Preterm AB Living   1 1 1     1    SAB TAB Ectopic Multiple Live Births           1      Menarche age: 46 No LMP recorded. Patient is postmenopausal.    The following portions of the patient's history were reviewed and updated as appropriate: allergies, current medications, past family history, past medical history, past social history, past surgical history and problem list.  Review of Systems Pertinent items are noted in HPI.   Works at [employer], just went part time   Objective:    BP (!) 144/83   Pulse 73   Ht 5\' 2"  (1.575 m)   Wt 131 lb (59.4 kg)   BMI 23.96 kg/m   General Appearance:    Alert, cooperative, no distress, appears stated age  Head:    Normocephalic, without obvious abnormality, atraumatic  Eyes:    PERRL, conjunctiva/corneas clear, EOM's intact, fundi    benign, both eyes  Ears:    Normal TM's and external ear canals, both ears  Nose:   Nares normal, septum midline, mucosa normal, no drainage    or sinus tenderness  Throat:   Lips, mucosa, and tongue normal; teeth and gums normal  Neck:   Supple, symmetrical, trachea midline, no adenopathy;    thyroid:  no enlargement/tenderness/nodules; no carotid   bruit or JVD  Back:     Symmetric, no curvature, ROM normal, no CVA tenderness  Lungs:     Clear to auscultation bilaterally, respirations unlabored   Chest Wall:    No tenderness or deformity   Heart:    Regular rate and rhythm, S1 and S2 normal, no murmur, rub   or gallop  Breast Exam:    No tenderness, masses, or nipple abnormality  Abdomen:     Soft, non-tender, bowel sounds active all four quadrants,    no masses, no organomegaly  Genitalia:    Normal female without lesion, discharge or tenderness, very small vagina, marked V V A, nulliparous cervix, NSSA, NT, no adnexal tenderness or masses     Extremities:   Extremities normal, atraumatic, no cyanosis or edema  Pulses:   2+ and symmetric all extremities  Skin:   Skin color, texture, turgor normal, no rashes or lesions  Lymph nodes:   Cervical, supraclavicular, and axillary nodes normal  Neurologic:   CNII-XII intact, normal strength, sensation and reflexes    throughout   .    Assessment:    Healthy female exam.   Vaginal dyspareunia due to small vagina and V V A   Plan:     Thin prep Pap smear. with cotesing Trial of Osphena Rec vaginal dilators RTC for fasting labs

## 2016-03-12 LAB — CYTOLOGY - PAP
Diagnosis: NEGATIVE
HPV: NOT DETECTED

## 2016-03-13 ENCOUNTER — Other Ambulatory Visit (INDEPENDENT_AMBULATORY_CARE_PROVIDER_SITE_OTHER): Payer: BLUE CROSS/BLUE SHIELD

## 2016-03-13 DIAGNOSIS — Z01419 Encounter for gynecological examination (general) (routine) without abnormal findings: Secondary | ICD-10-CM

## 2016-03-13 DIAGNOSIS — Z0000 Encounter for general adult medical examination without abnormal findings: Secondary | ICD-10-CM | POA: Diagnosis not present

## 2016-03-14 ENCOUNTER — Telehealth: Payer: Self-pay | Admitting: *Deleted

## 2016-03-14 LAB — LIPID PANEL
Cholesterol: 210 mg/dL — ABNORMAL HIGH (ref <200)
HDL: 99 mg/dL (ref >50)
LDL CALC: 101 mg/dL — AB (ref <100)
Total CHOL/HDL Ratio: 2.1 Ratio (ref <5.0)
Triglycerides: 49 mg/dL (ref <150)
VLDL: 10 mg/dL (ref <30)

## 2016-03-14 LAB — CBC
HEMATOCRIT: 44.5 % (ref 35.0–45.0)
Hemoglobin: 14.5 g/dL (ref 11.7–15.5)
MCH: 29.4 pg (ref 27.0–33.0)
MCHC: 32.6 g/dL (ref 32.0–36.0)
MCV: 90.1 fL (ref 80.0–100.0)
MPV: 9.7 fL (ref 7.5–12.5)
Platelets: 255 10*3/uL (ref 140–400)
RBC: 4.94 MIL/uL (ref 3.80–5.10)
RDW: 14.3 % (ref 11.0–15.0)
WBC: 4.6 10*3/uL (ref 3.8–10.8)

## 2016-03-14 LAB — COMPREHENSIVE METABOLIC PANEL
ALK PHOS: 61 U/L (ref 33–130)
ALT: 13 U/L (ref 6–29)
AST: 19 U/L (ref 10–35)
Albumin: 4.2 g/dL (ref 3.6–5.1)
BUN: 20 mg/dL (ref 7–25)
CO2: 25 mmol/L (ref 20–31)
CREATININE: 0.9 mg/dL (ref 0.50–1.05)
Calcium: 9 mg/dL (ref 8.6–10.4)
Chloride: 102 mmol/L (ref 98–110)
Glucose, Bld: 69 mg/dL (ref 65–99)
Potassium: 4.3 mmol/L (ref 3.5–5.3)
SODIUM: 140 mmol/L (ref 135–146)
Total Bilirubin: 1.6 mg/dL — ABNORMAL HIGH (ref 0.2–1.2)
Total Protein: 6.6 g/dL (ref 6.1–8.1)

## 2016-03-14 LAB — VITAMIN D 25 HYDROXY (VIT D DEFICIENCY, FRACTURES): VIT D 25 HYDROXY: 24 ng/mL — AB (ref 30–100)

## 2016-03-14 LAB — TSH: TSH: 1.11 m[IU]/L

## 2016-03-14 NOTE — Telephone Encounter (Signed)
-----   Message from [name], MD sent at 03/14/2016  8:43 AM EST ----- She will need 8 weeks of weekly Vitamin D 50,000 units and then a recheck of her level.  She also needs a low fat diet due to her elevated cholesterol.  Thanks

## 2016-03-17 ENCOUNTER — Telehealth: Payer: Self-pay | Admitting: *Deleted

## 2016-03-17 DIAGNOSIS — R7989 Other specified abnormal findings of blood chemistry: Secondary | ICD-10-CM

## 2016-03-17 MED ORDER — VITAMIN D (ERGOCALCIFEROL) 1.25 MG (50000 UNIT) PO CAPS
50000.0000 [IU] | ORAL_CAPSULE | ORAL | 0 refills | Status: DC
Start: 2016-03-17 — End: 2020-05-15

## 2016-03-17 NOTE — Telephone Encounter (Signed)
Pt notified of labwork and RX was sent to [place] for Vitamin D 50K

## 2016-05-14 DIAGNOSIS — Q631 Lobulated, fused and horseshoe kidney: Secondary | ICD-10-CM | POA: Insufficient documentation

## 2016-05-20 ENCOUNTER — Other Ambulatory Visit: Payer: BLUE CROSS/BLUE SHIELD

## 2016-05-20 DIAGNOSIS — R7989 Other specified abnormal findings of blood chemistry: Secondary | ICD-10-CM

## 2016-05-20 NOTE — Progress Notes (Signed)
Pt came to get Vitamin D level rechecked. Sent her downstairs to lab with lab sheet.

## 2016-05-21 LAB — VITAMIN D 25 HYDROXY (VIT D DEFICIENCY, FRACTURES): VIT D 25 HYDROXY: 29 ng/mL — AB (ref 30–100)

## 2016-05-22 ENCOUNTER — Telehealth: Payer: Self-pay | Admitting: *Deleted

## 2016-05-22 NOTE — Telephone Encounter (Signed)
-----   Message from [name], MD sent at 05/21/2016  1:39 PM EDT ----- Her vit D is still low. Can you please send her to her Primary care about this? Thanks

## 2016-05-22 NOTE — Telephone Encounter (Signed)
Pt notified of Vitamin B level which continues to be on the low side.  Per Dr Hulan Fray she will need to f/u with her PCP for further evaluation.

## 2016-06-10 DIAGNOSIS — Z87442 Personal history of urinary calculi: Secondary | ICD-10-CM | POA: Insufficient documentation

## 2016-08-11 DIAGNOSIS — K648 Other hemorrhoids: Secondary | ICD-10-CM | POA: Insufficient documentation

## 2016-12-10 DIAGNOSIS — N281 Cyst of kidney, acquired: Secondary | ICD-10-CM | POA: Insufficient documentation

## 2017-02-03 ENCOUNTER — Other Ambulatory Visit (HOSPITAL_COMMUNITY): Payer: Self-pay | Admitting: Obstetrics & Gynecology

## 2017-02-03 DIAGNOSIS — Z1239 Encounter for other screening for malignant neoplasm of breast: Secondary | ICD-10-CM

## 2017-03-05 ENCOUNTER — Ambulatory Visit: Payer: BLUE CROSS/BLUE SHIELD

## 2017-03-06 ENCOUNTER — Ambulatory Visit (INDEPENDENT_AMBULATORY_CARE_PROVIDER_SITE_OTHER): Payer: BLUE CROSS/BLUE SHIELD

## 2017-03-06 DIAGNOSIS — Z1239 Encounter for other screening for malignant neoplasm of breast: Secondary | ICD-10-CM

## 2017-03-06 DIAGNOSIS — Z1231 Encounter for screening mammogram for malignant neoplasm of breast: Secondary | ICD-10-CM

## 2017-03-12 ENCOUNTER — Ambulatory Visit (INDEPENDENT_AMBULATORY_CARE_PROVIDER_SITE_OTHER): Payer: BLUE CROSS/BLUE SHIELD | Admitting: Obstetrics & Gynecology

## 2017-03-12 ENCOUNTER — Encounter: Payer: Self-pay | Admitting: Obstetrics & Gynecology

## 2017-03-12 VITALS — BP 142/81 | HR 73 | Ht 62.0 in | Wt 136.0 lb

## 2017-03-12 DIAGNOSIS — Z124 Encounter for screening for malignant neoplasm of cervix: Secondary | ICD-10-CM

## 2017-03-12 DIAGNOSIS — Z1151 Encounter for screening for human papillomavirus (HPV): Secondary | ICD-10-CM | POA: Diagnosis not present

## 2017-03-12 DIAGNOSIS — Z01419 Encounter for gynecological examination (general) (routine) without abnormal findings: Secondary | ICD-10-CM | POA: Diagnosis not present

## 2017-03-12 NOTE — Progress Notes (Signed)
Subjective:    [name] is a 57 y.o. married P1 (33 yo son + adopted daughter 53 yo) female who presents for an annual exam. The patient has no complaints today. The patient is sexually active. GYN screening history: last pap: was normal. The patient wears seatbelts: yes. The patient participates in regular exercise: yes. Has the patient ever been transfused or tattooed?: no. The patient reports that there is not domestic violence in her life.   Menstrual History: OB History    Gravida Para Term Preterm AB Living   1 1 1     1    SAB TAB Ectopic Multiple Live Births           1      Menarche age: 65 No LMP recorded. Patient is postmenopausal.    The following portions of the patient's history were reviewed and updated as appropriate: allergies, current medications, past family history, past medical history, past social history, past surgical history and problem list.  Review of Systems Pertinent items are noted in HPI.   Mammogram normal recently Married for 26 years Still with some dyspareunia. Dilators help some, when she uses them Had flu vaccine this seaon FH- + colon cancer in maternal GF, no breast or gyn cancer Works at [employer]   Objective:    BP (!) 142/81   Pulse 73   Ht 5\' 2"  (1.575 m)   Wt 136 lb (61.7 kg)   BMI 24.87 kg/m   General Appearance:    Alert, cooperative, no distress, appears stated age  Head:    Normocephalic, without obvious abnormality, atraumatic  Eyes:    PERRL, conjunctiva/corneas clear, EOM's intact, fundi    benign, both eyes  Ears:    Normal TM's and external ear canals, both ears  Nose:   Nares normal, septum midline, mucosa normal, no drainage    or sinus tenderness  Throat:   Lips, mucosa, and tongue normal; teeth and gums normal  Neck:   Supple, symmetrical, trachea midline, no adenopathy;    thyroid:  no enlargement/tenderness/nodules; no carotid   bruit or JVD  Back:     Symmetric, no curvature, ROM normal, no CVA  tenderness  Lungs:     Clear to auscultation bilaterally, respirations unlabored  Chest Wall:    No tenderness or deformity   Heart:    Regular rate and rhythm, S1 and S2 normal, no murmur, rub   or gallop  Breast Exam:    No tenderness, masses, or nipple abnormality  Abdomen:     Soft, non-tender, bowel sounds active all four quadrants,    no masses, no organomegaly  Genitalia:    Normal female without lesion, discharge or tenderness, normal size and shape, anteverted, mobile, non-tender, normal adnexal exam      Extremities:   Extremities normal, atraumatic, no cyanosis or edema  Pulses:   2+ and symmetric all extremities  Skin:   Skin color, texture, turgor normal, no rashes or lesions  Lymph nodes:   Cervical, supraclavicular, and axillary nodes normal  Neurologic:   CNII-XII intact, normal strength, sensation and reflexes    throughout  .    Assessment:    Healthy female exam.    Plan:     Thin prep Pap smear. with cotesting

## 2017-03-16 LAB — CYTOLOGY - PAP
Diagnosis: NEGATIVE
HPV: NOT DETECTED

## 2018-03-05 ENCOUNTER — Other Ambulatory Visit: Payer: Self-pay | Admitting: Family Medicine

## 2018-03-05 DIAGNOSIS — Z1231 Encounter for screening mammogram for malignant neoplasm of breast: Secondary | ICD-10-CM

## 2018-03-10 ENCOUNTER — Ambulatory Visit (INDEPENDENT_AMBULATORY_CARE_PROVIDER_SITE_OTHER): Payer: BLUE CROSS/BLUE SHIELD

## 2018-03-10 DIAGNOSIS — Z1231 Encounter for screening mammogram for malignant neoplasm of breast: Secondary | ICD-10-CM | POA: Diagnosis not present

## 2019-03-02 ENCOUNTER — Other Ambulatory Visit: Payer: Self-pay | Admitting: Family Medicine

## 2019-03-02 DIAGNOSIS — Z1231 Encounter for screening mammogram for malignant neoplasm of breast: Secondary | ICD-10-CM

## 2019-03-17 ENCOUNTER — Ambulatory Visit (INDEPENDENT_AMBULATORY_CARE_PROVIDER_SITE_OTHER): Payer: BC Managed Care – PPO

## 2019-03-17 ENCOUNTER — Other Ambulatory Visit: Payer: Self-pay

## 2019-03-17 ENCOUNTER — Ambulatory Visit: Payer: BLUE CROSS/BLUE SHIELD

## 2019-03-17 DIAGNOSIS — Z1231 Encounter for screening mammogram for malignant neoplasm of breast: Secondary | ICD-10-CM | POA: Diagnosis not present

## 2019-06-15 ENCOUNTER — Other Ambulatory Visit: Payer: Self-pay | Admitting: Family Medicine

## 2019-06-15 DIAGNOSIS — S299XXA Unspecified injury of thorax, initial encounter: Secondary | ICD-10-CM

## 2019-07-11 ENCOUNTER — Ambulatory Visit: Payer: BC Managed Care – PPO

## 2019-07-11 ENCOUNTER — Other Ambulatory Visit: Payer: Self-pay

## 2019-07-11 ENCOUNTER — Ambulatory Visit
Admission: RE | Admit: 2019-07-11 | Discharge: 2019-07-11 | Disposition: A | Discharge status: Home / Self Care | Payer: BC Managed Care – PPO | Source: Ambulatory Visit | Attending: Family Medicine | Admitting: Family Medicine

## 2019-07-11 DIAGNOSIS — S299XXA Unspecified injury of thorax, initial encounter: Secondary | ICD-10-CM

## 2020-03-29 ENCOUNTER — Other Ambulatory Visit: Payer: Self-pay

## 2020-03-29 ENCOUNTER — Ambulatory Visit (INDEPENDENT_AMBULATORY_CARE_PROVIDER_SITE_OTHER): Payer: BC Managed Care – PPO | Admitting: Obstetrics and Gynecology

## 2020-03-29 ENCOUNTER — Encounter: Payer: Self-pay | Admitting: Obstetrics and Gynecology

## 2020-03-29 ENCOUNTER — Other Ambulatory Visit (HOSPITAL_COMMUNITY)
Admission: RE | Admit: 2020-03-29 | Discharge: 2020-03-29 | Disposition: A | Discharge status: Home / Self Care | Payer: BC Managed Care – PPO | Source: Ambulatory Visit | Attending: Obstetrics and Gynecology | Admitting: Obstetrics and Gynecology

## 2020-03-29 VITALS — BP 150/82 | HR 78 | Ht 62.0 in | Wt 133.0 lb

## 2020-03-29 DIAGNOSIS — Z124 Encounter for screening for malignant neoplasm of cervix: Secondary | ICD-10-CM | POA: Diagnosis present

## 2020-03-29 DIAGNOSIS — Z1231 Encounter for screening mammogram for malignant neoplasm of breast: Secondary | ICD-10-CM

## 2020-03-29 DIAGNOSIS — Z7189 Other specified counseling: Secondary | ICD-10-CM

## 2020-03-29 DIAGNOSIS — Z01419 Encounter for gynecological examination (general) (routine) without abnormal findings: Secondary | ICD-10-CM | POA: Diagnosis not present

## 2020-03-29 DIAGNOSIS — R87620 Atypical squamous cells of undetermined significance on cytologic smear of vagina (ASC-US): Secondary | ICD-10-CM

## 2020-03-29 DIAGNOSIS — Z01411 Encounter for gynecological examination (general) (routine) with abnormal findings: Secondary | ICD-10-CM

## 2020-03-29 DIAGNOSIS — Z8739 Personal history of other diseases of the musculoskeletal system and connective tissue: Secondary | ICD-10-CM | POA: Diagnosis not present

## 2020-03-29 DIAGNOSIS — N941 Unspecified dyspareunia: Secondary | ICD-10-CM

## 2020-03-29 NOTE — Progress Notes (Signed)
GYNECOLOGY ANNUAL PREVENTATIVE CARE ENCOUNTER NOTE  Subjective:   [name] is a 60 y.o. G68P1001 female here for a annual gynecologic exam. Current complaints: painful intercourse. Has tried [employer], [employer], dilators (help when she does use them, but does not use them consistently).    Denies abnormal vaginal bleeding, discharge, pelvic pain, or other gynecologic concerns. Declines STI screen.   Gynecologic History No LMP recorded. Patient is postmenopausal. Contraception: post menopausal status Last Pap: 2019. Results:  normal Last mammogram: 03/2019. Results: Birads 1 DEXA: has had years ago  Obstetric History OB History  Gravida Para Term Preterm AB Living  1 1 1     1   SAB IAB Ectopic Multiple Live Births          1    # Outcome Date GA Lbr Len/2nd Weight Sex Delivery Anes PTL Lv  1 Term 12/17/93    M CS-Unspec EPI N LIV    Past Medical History:  Diagnosis Date  . Abnormal Pap smear    ASCUS/LGSIL  . 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 Gilbert's syndrome    pt denies problems  . History of kidney stones   . Infertility    Seen by RE @ [facility]  . PONV (postoperative nausea and vomiting)   . Vaginitis, atrophic     Past Surgical History:  Procedure Laterality Date  . CESAREAN SECTION  12/17/1993   [facility]  . DIAGNOSTIC LAPAROSCOPY    . HYSTEROSCOPY WITH D & C N/A 03/09/2014   Procedure: DILATATION AND CURETTAGE /HYSTEROSCOPY;  Surgeon: [name], MD;  Location: [facility];  Service: Gynecology;  Laterality: N/A;  . kidney stone removal  2010  . monalisa      Current Outpatient Medications on File Prior to Visit  Medication Sig Dispense Refill  . Ascorbic Acid (VITAMIN C) 100 MG tablet Take 100 mg by mouth daily.    . Vitamin D, Ergocalciferol, (DRISDOL) 50000 units CAPS capsule Take 1 capsule (50,000 Units total) by mouth every 7 (seven) days. 8 capsule 0  . XIIDRA 5 % SOLN     . ibuprofen (ADVIL,MOTRIN) 200 MG tablet Take 200  mg by mouth.    . Ospemifene 60 MG TABS Take 1 tablet by mouth daily. 30 tablet 12   No current facility-administered medications on file prior to visit.    No Known Allergies  Social History   Socioeconomic History  . Marital status: Married    Spouse name: Not on file  . Number of children: Not on file  . Years of education: Not on file  . Highest education level: Not on file  Occupational History  . Occupation: Product manager: [employer]  Tobacco Use  . Smoking status: Never Smoker  . Smokeless tobacco: Never Used  Substance and Sexual Activity  . Alcohol use: No  . Drug use: No  . Sexual activity: Yes    Partners: Male    Birth control/protection: Post-menopausal  Other Topics Concern  . Not on file  Social History Narrative  . Not on file   Social Determinants of Health   Financial Resource Strain: Not on file  Food Insecurity: Not on file  Transportation Needs: Not on file  Physical Activity: Not on file  Stress: Not on file  Social Connections: Not on file  Intimate Partner Violence: Not on file    Family History  Problem Relation Age of  Onset  . Hypertension Father   . Diabetes Mother     The following portions of the patient's history were reviewed and updated as appropriate: allergies, current medications, past family history, past medical history, past social history, past surgical history and problem list.  Review of Systems Pertinent items are noted in HPI.   Objective:  BP (!) 150/82   Pulse 78   Ht 5\' 2"  (1.575 m)   Wt 133 lb (60.3 kg)   BMI 24.33 kg/m  CONSTITUTIONAL: Well-developed, well-nourished female in no acute distress.  HENT:  Normocephalic, atraumatic, External right and left ear normal. Oropharynx is clear and moist EYES: Conjunctivae and EOM are normal. Pupils are equal, round, and reactive to light. No scleral icterus.  NECK: Normal range of motion, supple, no masses.  Normal thyroid.  SKIN: Skin is  warm and dry. No rash noted. Not diaphoretic. No erythema. No pallor. NEUROLOGIC: Alert and oriented to person, place, and time. Normal reflexes, muscle tone coordination. No cranial nerve deficit noted. PSYCHIATRIC: Normal mood and affect. Normal behavior. Normal judgment and thought content. CARDIOVASCULAR: Normal heart rate noted RESPIRATORY: Effort normal, no problems with respiration noted. BREASTS: Symmetric in size. No masses, skin changes, nipple drainage, or lymphadenopathy. ABDOMEN: Soft, no distention noted.  No tenderness, rebound or guarding.  PELVIC: Normal appearing external genitalia; normal but small appearing vaginal mucosa and cervix. (used pediatric speculum)  No abnormal discharge noted.  Pap smear obtained. Normal uterine size, no other palpable masses, no uterine or adnexal tenderness. MUSCULOSKELETAL: Normal range of motion. No tenderness.  No cyanosis, clubbing, or edema.  2+ distal pulses.  Exam done with chaperone present.   Assessment and Plan:   1. H/O osteopenia - DG Bone Density; Future  2. Well woman exam Healthy female exam - Cytology - PAP( [facility])  3. Cervical cancer screening - Cytology - PAP( )  4. Encounter for screening mammogram for malignant neoplasm of breast  5. Counseled about COVID-19 virus infection COVID-19 Vaccine Counseling: The patient was counseled on the potential benefits and lack of known risks of COVID vaccination, during pregnancy and breastfeeding, during today's visit. The patient's questions and concerns were addressed today, including safety of the vaccination and potential side effects as they have been published by ACOG and SM[facility]. The patient has been informed that there have not been any documented vaccine related injuries, deaths or birth defects to infant or mom after receiving the COVID-19 vaccine to date. The patient has been made aware that although she is not at increased risk of contracting COVID-19  during pregnancy, she is at increased risk of developing severe disease and complications if she contracts COVID-19 while pregnant. All patient questions were addressed during our visit today. The patient is still unsure of her decision for vaccination.   6. Dyspareunia in female Has tried dilators with some success but finds it hard to do them regularly - consult with urogyn, likely will need PFPT - she is agreeable to plan - Ambulatory referral to Urogynecology   Will follow up results of pap smear and manage accordingly. Encouraged improvement in diet and exercise.  COVID vaccine recommended Declines STI screen. Mammogram UTD Referral for colonoscopy n/a, upcoming at age 17 Flu vaccine, would like to get next week DEXA due based on h/o osteopenia, last 7 yrs ago  Routine preventative health maintenance measures emphasized. Please refer to After Visit Summary for other counseling recommendations.     Feliz Beam, MD, [place]  for [employer])

## 2020-03-29 NOTE — Progress Notes (Signed)
Declines flu vaccine Declines COVID vaccine Mam scheduled for Feb  Last pap- 03/12/17- negative

## 2020-04-02 LAB — CYTOLOGY - PAP
Comment: NEGATIVE
Diagnosis: UNDETERMINED — AB
High risk HPV: NEGATIVE

## 2020-04-04 ENCOUNTER — Other Ambulatory Visit: Payer: Self-pay

## 2020-04-04 ENCOUNTER — Ambulatory Visit (INDEPENDENT_AMBULATORY_CARE_PROVIDER_SITE_OTHER): Payer: BC Managed Care – PPO

## 2020-04-04 DIAGNOSIS — Z13820 Encounter for screening for osteoporosis: Secondary | ICD-10-CM

## 2020-04-04 DIAGNOSIS — Z8739 Personal history of other diseases of the musculoskeletal system and connective tissue: Secondary | ICD-10-CM

## 2020-04-13 ENCOUNTER — Other Ambulatory Visit: Payer: Self-pay | Admitting: Family Medicine

## 2020-04-13 DIAGNOSIS — Z1231 Encounter for screening mammogram for malignant neoplasm of breast: Secondary | ICD-10-CM

## 2020-04-19 ENCOUNTER — Ambulatory Visit (INDEPENDENT_AMBULATORY_CARE_PROVIDER_SITE_OTHER): Payer: BC Managed Care – PPO

## 2020-04-19 ENCOUNTER — Other Ambulatory Visit: Payer: Self-pay

## 2020-04-19 DIAGNOSIS — Z1231 Encounter for screening mammogram for malignant neoplasm of breast: Secondary | ICD-10-CM

## 2020-04-25 ENCOUNTER — Other Ambulatory Visit: Payer: Self-pay | Admitting: Family Medicine

## 2020-04-25 DIAGNOSIS — R928 Other abnormal and inconclusive findings on diagnostic imaging of breast: Secondary | ICD-10-CM

## 2020-04-30 ENCOUNTER — Ambulatory Visit
Admission: RE | Admit: 2020-04-30 | Discharge: 2020-04-30 | Disposition: A | Discharge status: Home / Self Care | Payer: BC Managed Care – PPO | Source: Ambulatory Visit | Attending: Family Medicine | Admitting: Family Medicine

## 2020-04-30 ENCOUNTER — Other Ambulatory Visit: Payer: Self-pay

## 2020-04-30 ENCOUNTER — Other Ambulatory Visit: Payer: Self-pay | Admitting: Family Medicine

## 2020-04-30 DIAGNOSIS — R928 Other abnormal and inconclusive findings on diagnostic imaging of breast: Secondary | ICD-10-CM

## 2020-05-01 DIAGNOSIS — C50919 Malignant neoplasm of unspecified site of unspecified female breast: Secondary | ICD-10-CM

## 2020-05-01 HISTORY — DX: Malignant neoplasm of unspecified site of unspecified female breast: C50.919

## 2020-05-03 ENCOUNTER — Telehealth: Payer: Self-pay | Admitting: Hematology and Oncology

## 2020-05-03 NOTE — Telephone Encounter (Signed)
Spoke to patient to confirm afternoon clinic for 3/9, packet sent via email

## 2020-05-04 ENCOUNTER — Encounter: Payer: Self-pay | Admitting: *Deleted

## 2020-05-04 DIAGNOSIS — C50512 Malignant neoplasm of lower-outer quadrant of left female breast: Secondary | ICD-10-CM

## 2020-05-04 DIAGNOSIS — Z170 Estrogen receptor positive status [ER+]: Secondary | ICD-10-CM | POA: Insufficient documentation

## 2020-05-07 ENCOUNTER — Ambulatory Visit: Payer: BC Managed Care – PPO | Admitting: Obstetrics and Gynecology

## 2020-05-08 ENCOUNTER — Ambulatory Visit: Payer: BC Managed Care – PPO | Admitting: Obstetrics and Gynecology

## 2020-05-08 NOTE — Progress Notes (Signed)
[place] NOTE  Patient Care Team: Georgia Lopes, DO as PCP - General (Family Medicine) Mauro Kaufmann, RN as Oncology Nurse Navigator Rockwell Germany, RN as Oncology Nurse Navigator [name], MD as Consulting Physician (General Surgery) Nicholas Lose, MD as Consulting Physician (Hematology and Oncology) Eppie Gibson, MD as Attending Physician (Radiation Oncology)  CHIEF COMPLAINTS/PURPOSE OF CONSULTATION:  Newly diagnosed breast cancer  HISTORY OF PRESENTING ILLNESS:  [name] 60 y.o. female is here because of recent diagnosis of invasive ductal carcinoma of the left breast. Screening mammogram on 04/19/20 showed a left breast asymmetry. Diagnostic mammogram and [place] on 04/30/20 showed a 0.3cm mass at the 6 o'clock position and no left axillary adenopathy. Biopsy on 04/30/20 showed invasive and in situ ductal carcinoma, grade 2, HER-2 equivocal by IHC, negative by FISH (ratio 1.27), ER+ 95%, PR+ 90%, Ki67 10%. She presents to the clinic today for initial evaluation and discussion of treatment options.   I reviewed her records extensively and collaborated the history with the patient.  SUMMARY OF ONCOLOGIC HISTORY: Oncology History  Malignant neoplasm of lower-outer quadrant of left breast of female, estrogen receptor positive ([place])  05/04/2020 Initial Diagnosis   Screening mammogram showed a left breast asymmetry. Diagnostic mammogram and US showed a 0.3cm mass at the 6 o'clock position and no left axillary adenopathy. Biopsy showed invasive and in situ ductal carcinoma, grade 2, HER-2 equivocal by IHC, negative by FISH (ratio 1.27), ER+ 95%, PR+ 90%, Ki67 10%.   05/09/2020 Cancer Staging   Staging form: Breast, AJCC 8th Edition - Clinical stage from 05/09/2020: Stage IA (cT1a, cN0, cM0, G1, ER+, PR+, HER2-) - Signed by Nicholas Lose, MD on 05/09/2020 Stage prefix: Initial diagnosis     MEDICAL HISTORY:  Past Medical History:  Diagnosis Date  .  Abnormal Pap smear    ASCUS/LGSIL  . 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 Gilbert's syndrome    pt denies problems  . History of kidney stones   . Infertility    Seen by RE @ [facility]  . PONV (postoperative nausea and vomiting)   . Vaginitis, atrophic     SURGICAL HISTORY: Past Surgical History:  Procedure Laterality Date  . CESAREAN SECTION  12/17/1993   [facility]  . DIAGNOSTIC LAPAROSCOPY    . HYSTEROSCOPY WITH D & C N/A 03/09/2014   Procedure: DILATATION AND CURETTAGE /HYSTEROSCOPY;  Surgeon: [name], MD;  Location: [place] [facility];  Service: Gynecology;  Laterality: N/A;  . kidney stone removal  2010  . monalisa      SOCIAL HISTORY: Social History   Socioeconomic History  . Marital status: Married    Spouse name: Not on file  . Number of children: Not on file  . Years of education: Not on file  . Highest education level: Not on file  Occupational History  . Occupation: Product manager: [employer]  Tobacco Use  . Smoking status: Never Smoker  . Smokeless tobacco: Never Used  Substance and Sexual Activity  . Alcohol use: No  . Drug use: No  . Sexual activity: Yes    Partners: Male    Birth control/protection: Post-menopausal  Other Topics Concern  . Not on file  Social History Narrative  . Not on file   Social Determinants of Health   Financial Resource Strain: Not on file  Food Insecurity: Not on file  Transportation Needs: Not on file  Physical Activity: Not on file  Stress: Not on file  Social Connections: Not on file  Intimate Partner Violence: Not on file    FAMILY HISTORY: Family History  Problem Relation Age of Onset  . Hypertension Father   . Diabetes Mother   . Colon cancer Maternal Grandfather     ALLERGIES:  has No Known Allergies.  MEDICATIONS:  Current Outpatient Medications  Medication Sig Dispense Refill  . Ascorbic Acid (VITAMIN C) 100 MG tablet Take 100 mg by mouth daily.    . Multiple  Vitamins-Minerals (ZINC PO) Take 50 mg by mouth daily.    . Vitamin D, Ergocalciferol, (DRISDOL) 50000 units CAPS capsule Take 1 capsule (50,000 Units total) by mouth every 7 (seven) days. 8 capsule 0  . XIIDRA 5 % SOLN     . ibuprofen (ADVIL,MOTRIN) 200 MG tablet Take 200 mg by mouth.     No current facility-administered medications for this visit.    REVIEW OF SYSTEMS:   Constitutional: Denies fevers, chills or abnormal night sweats   All other systems were reviewed with the patient and are negative.  PHYSICAL EXAMINATION: ECOG PERFORMANCE STATUS: 1 - Symptomatic but completely ambulatory  Vitals:   05/09/20 1246  BP: (!) 164/75  Pulse: 84  Resp: 18  Temp: 98.1 F (36.7 C)  SpO2: 98%   Filed Weights   05/09/20 1246  Weight: 132 lb 4.8 oz (60 kg)       LABORATORY DATA:  I have reviewed the data as listed Lab Results  Component Value Date   WBC 7.3 05/09/2020   HGB 14.6 05/09/2020   HCT 44.3 05/09/2020   MCV 89.9 05/09/2020   PLT 272 05/09/2020   Lab Results  Component Value Date   NA 142 05/09/2020   K 3.9 05/09/2020   CL 106 05/09/2020   CO2 27 05/09/2020    RADIOGRAPHIC STUDIES: I have personally reviewed the radiological reports and agreed with the findings in the report.  ASSESSMENT AND PLAN:  Malignant neoplasm of lower-outer quadrant of left breast of female, estrogen receptor positive ([facility]) 05/04/2020:Screening mammogram showed a left breast asymmetry. Diagnostic mammogram and US showed a 0.3cm mass at the 6 o'clock position and no left axillary adenopathy. Biopsy showed invasive and in situ ductal carcinoma, grade 2, HER-2 equivocal by IHC, negative by FISH (ratio 1.27), ER+ 95%, PR+ 90%, Ki67 10%.  Pathology and radiology counseling:Discussed with the patient, the details of pathology including the type of breast cancer,the clinical staging, the significance of ER, PR and HER-2/neu receptors and the implications for treatment. After reviewing the  pathology in detail, we proceeded to discuss the different treatment options between surgery, radiation, chemotherapy, antiestrogen therapies.  Recommendations: 1. Breast conserving surgery followed by 2. Adjuvant radiation therapy (patient was not very keen on radiation but she will undergo based on our recommendation) 3. Adjuvant antiestrogen therapy  Anastrozole counseling: We discussed the risks and benefits of anti-estrogen therapy with aromatase inhibitors. These include but not limited to insomnia, hot flashes, mood changes, vaginal dryness, bone density loss, and weight gain. We strongly believe that the benefits far outweigh the risks. Patient understands these risks and consented to starting treatment. Planned treatment duration is 5 years.   Return to clinic after surgery to discuss final pathology report     All questions were answered. The patient knows to call the clinic with any problems, questions or concerns.   Sabas Sous, MD, MPH 05/09/2020    I, Molly Dorshimer, am  acting as scribe for Nicholas Lose, MD.  I have reviewed the above documentation for accuracy and completeness, and I agree with the above.

## 2020-05-09 ENCOUNTER — Encounter: Payer: Self-pay | Admitting: Physical Therapy

## 2020-05-09 ENCOUNTER — Inpatient Hospital Stay: Payer: BC Managed Care – PPO

## 2020-05-09 ENCOUNTER — Ambulatory Visit: Payer: Self-pay | Admitting: Surgery

## 2020-05-09 ENCOUNTER — Encounter: Payer: Self-pay | Admitting: *Deleted

## 2020-05-09 ENCOUNTER — Other Ambulatory Visit: Payer: Self-pay

## 2020-05-09 ENCOUNTER — Inpatient Hospital Stay: Payer: BC Managed Care – PPO | Attending: Hematology and Oncology | Admitting: Hematology and Oncology

## 2020-05-09 ENCOUNTER — Ambulatory Visit: Payer: BC Managed Care – PPO | Attending: Surgery | Admitting: Physical Therapy

## 2020-05-09 ENCOUNTER — Other Ambulatory Visit: Payer: BC Managed Care – PPO

## 2020-05-09 ENCOUNTER — Encounter: Payer: Self-pay | Admitting: Radiation Oncology

## 2020-05-09 ENCOUNTER — Ambulatory Visit
Admission: RE | Admit: 2020-05-09 | Discharge: 2020-05-09 | Disposition: A | Discharge status: Home / Self Care | Payer: BC Managed Care – PPO | Source: Ambulatory Visit | Attending: Radiation Oncology | Admitting: Radiation Oncology

## 2020-05-09 DIAGNOSIS — C50512 Malignant neoplasm of lower-outer quadrant of left female breast: Secondary | ICD-10-CM

## 2020-05-09 DIAGNOSIS — N189 Chronic kidney disease, unspecified: Secondary | ICD-10-CM

## 2020-05-09 DIAGNOSIS — Z170 Estrogen receptor positive status [ER+]: Secondary | ICD-10-CM

## 2020-05-09 DIAGNOSIS — C50912 Malignant neoplasm of unspecified site of left female breast: Secondary | ICD-10-CM

## 2020-05-09 DIAGNOSIS — R293 Abnormal posture: Secondary | ICD-10-CM | POA: Diagnosis present

## 2020-05-09 LAB — CMP (CANCER CENTER ONLY)
ALT: 15 U/L (ref 0–44)
AST: 18 U/L (ref 15–41)
Albumin: 4.1 g/dL (ref 3.5–5.0)
Alkaline Phosphatase: 74 U/L (ref 38–126)
Anion gap: 9 (ref 5–15)
BUN: 18 mg/dL (ref 6–20)
CO2: 27 mmol/L (ref 22–32)
Calcium: 9.5 mg/dL (ref 8.9–10.3)
Chloride: 106 mmol/L (ref 98–111)
Creatinine: 0.82 mg/dL (ref 0.44–1.00)
GFR, Estimated: >60 mL/min (ref >60)
Glucose, Bld: 95 mg/dL (ref 70–99)
Potassium: 3.9 mmol/L (ref 3.5–5.1)
Sodium: 142 mmol/L (ref 135–145)
Total Bilirubin: 1.5 mg/dL — ABNORMAL HIGH (ref 0.3–1.2)
Total Protein: 7.1 g/dL (ref 6.5–8.1)

## 2020-05-09 LAB — CBC WITH DIFFERENTIAL (CANCER CENTER ONLY)
Abs Immature Granulocytes: 0.02 10*3/uL (ref 0.00–0.07)
Basophils Absolute: 0 10*3/uL (ref 0.0–0.1)
Basophils Relative: 0 %
Eosinophils Absolute: 0 10*3/uL (ref 0.0–0.5)
Eosinophils Relative: 1 %
HCT: 44.3 % (ref 36.0–46.0)
Hemoglobin: 14.6 g/dL (ref 12.0–15.0)
Immature Granulocytes: 0 %
Lymphocytes Relative: 17 %
Lymphs Abs: 1.2 10*3/uL (ref 0.7–4.0)
MCH: 29.6 pg (ref 26.0–34.0)
MCHC: 33 g/dL (ref 30.0–36.0)
MCV: 89.9 fL (ref 80.0–100.0)
Monocytes Absolute: 0.4 10*3/uL (ref 0.1–1.0)
Monocytes Relative: 6 %
Neutro Abs: 5.5 10*3/uL (ref 1.7–7.7)
Neutrophils Relative %: 76 %
Platelet Count: 272 10*3/uL (ref 150–400)
RBC: 4.93 MIL/uL (ref 3.87–5.11)
RDW: 12.7 % (ref 11.5–15.5)
WBC Count: 7.3 10*3/uL (ref 4.0–10.5)
nRBC: 0 % (ref 0.0–0.2)

## 2020-05-09 LAB — GENETIC SCREENING ORDER

## 2020-05-09 NOTE — Progress Notes (Signed)
[place] Psychosocial Distress Screening Counseling Intern  Counseling intern was referred by distress screening protocol.  The patient scored a 6 on the Psychosocial Distress Thermometer which indicates moderate distress. Counseling intern met with patient in exam room" to assess for distress and other psychosocial needs. The patient was with her husband and reported feeling "much better now," with a distress now more like a 3 or 4. She has church and friend support, including a friend who recently had the same diagnosis and treatment. Her information concerns have been resolved and her anxiety is much lower.   ONCBCN DISTRESS SCREENING 05/09/2020  Screening Type Initial Screening  Distress experienced in past week (1-10) 6  Emotional problem type Nervousness/Anxiety;Adjusting to illness  Information Concerns Type Lack of info about diagnosis;Lack of info about treatment  Referral to support programs Yes   Follow up needed: No.   Gaylyn Rong Counseling Intern

## 2020-05-09 NOTE — Assessment & Plan Note (Signed)
05/04/2020:Screening mammogram showed a left breast asymmetry. Diagnostic mammogram and US showed a 0.3cm mass at the 6 o'clock position and no left axillary adenopathy. Biopsy showed invasive and in situ ductal carcinoma, grade 2, HER-2 equivocal by IHC, negative by FISH (ratio 1.27), ER+ 95%, PR+ 90%, Ki67 10%.  Pathology and radiology counseling:Discussed with the patient, the details of pathology including the type of breast cancer,the clinical staging, the significance of ER, PR and HER-2/neu receptors and the implications for treatment. After reviewing the pathology in detail, we proceeded to discuss the different treatment options between surgery, radiation, chemotherapy, antiestrogen therapies.  Recommendations: 1. Breast conserving surgery followed by 2. Adjuvant radiation therapy followed by 3. Adjuvant antiestrogen therapy  Anastrozole counseling: We discussed the risks and benefits of anti-estrogen therapy with aromatase inhibitors. These include but not limited to insomnia, hot flashes, mood changes, vaginal dryness, bone density loss, and weight gain. We strongly believe that the benefits far outweigh the risks. Patient understands these risks and consented to starting treatment. Planned treatment duration is 5 years.   Return to clinic after surgery to discuss final pathology report

## 2020-05-09 NOTE — H&P (Signed)
[facility] Appointment: 05/09/2020 1:00 PM Location: [facility] Patient #: [id number] DOB: [birth year] Undefined / Language: Cleophus Molt / Race: White Female  History of Present Illness Marcello Moores A. Luisfernando Brightwell MD; 05/09/2020 3:47 PM) Patient words: Pt presents to the [facility] mammogram mass 3 mm 6 oclock idc er pos pr pos her 2 neu neg  NO complaints of mass discharge or pain in either breasts.  The patient is a 60 year old female.   Past Surgical History Conni Slipper, RN; 05/09/2020 8:27 AM) Breast Biopsy Left. Cesarean Section - 1 Oral Surgery  Diagnostic Studies History Conni Slipper, RN; 05/09/2020 8:27 AM) Colonoscopy 5-10 years ago Mammogram within last year Pap Smear 1-5 years ago  Medication History Conni Slipper, RN; 05/09/2020 8:26 AM) Medications Reconciled  Social History Conni Slipper, RN; 05/09/2020 8:27 AM) Caffeine use Carbonated beverages, Tea. No alcohol use Tobacco use Never smoker.  Family History Conni Slipper, RN; 05/09/2020 8:27 AM) Colon Cancer Family Members In General. Diabetes Mellitus Mother. Heart disease in female family member before age 107 Hypertension Father, Mother. Kidney Disease Father. Thyroid problems Mother.  Pregnancy / Birth History Conni Slipper, RN; 05/09/2020 8:27 AM) Age at menarche 63 years. Age of menopause <46 Gravida 2 Maternal age 73-35 Para 68  Other Problems Conni Slipper, RN; 05/09/2020 8:27 AM) Anxiety Disorder Breast Cancer Hypercholesterolemia     Review of Systems Conni Slipper RN; 05/09/2020 8:27 AM) General Not Present- Appetite Loss, Chills, Fatigue, Fever, Night Sweats, Weight Gain and Weight Loss. Skin Not Present- Change in Wart/Mole, Dryness, Hives, Jaundice, New Lesions, Non-Healing Wounds, Rash and Ulcer. HEENT Present- Wears glasses/contact lenses. Not Present- Earache, Hearing Loss, Hoarseness, Nose Bleed, Oral Ulcers, Ringing in the Ears, Seasonal Allergies, Sinus Pain, Sore Throat, Visual  Disturbances and Yellow Eyes. Respiratory Not Present- Bloody sputum, Chronic Cough, Difficulty Breathing, Snoring and Wheezing. Cardiovascular Not Present- Chest Pain, Difficulty Breathing Lying Down, Leg Cramps, Palpitations, Rapid Heart Rate, Shortness of Breath and Swelling of Extremities. Gastrointestinal Not Present- Abdominal Pain, Bloating, Bloody Stool, Change in Bowel Habits, Chronic diarrhea, Constipation, Difficulty Swallowing, Excessive gas, Gets full quickly at meals, Hemorrhoids, Indigestion, Nausea, Rectal Pain and Vomiting. Female Genitourinary Not Present- Frequency, Nocturia, Painful Urination, Pelvic Pain and Urgency. Musculoskeletal Not Present- Back Pain, Joint Pain, Joint Stiffness, Muscle Pain, Muscle Weakness and Swelling of Extremities. Neurological Not Present- Decreased Memory, Fainting, Headaches, Numbness, Seizures, Tingling, Tremor, Trouble walking and Weakness. Psychiatric Present- Anxiety. Not Present- Bipolar, Change in Sleep Pattern, Depression, Fearful and Frequent crying. Endocrine Not Present- Cold Intolerance, Excessive Hunger, Hair Changes, Heat Intolerance, Hot flashes and New Diabetes. Hematology Not Present- Blood Thinners, Easy Bruising, Excessive bleeding, Gland problems, HIV and Persistent Infections.   Physical Exam (Leny Morozov A. Inetta Dicke MD; 05/09/2020 3:47 PM)  General Mental Status-Alert. General Appearance-Consistent with stated age. Hydration-Well hydrated. Voice-Normal.  Chest and Lung Exam Chest and lung exam reveals -quiet, even and easy respiratory effort with no use of accessory muscles and on auscultation, normal breath sounds, no adventitious sounds and normal vocal resonance. Inspection Chest Wall - Normal. Back - normal.  Breast Breast - Left-Symmetric, Non Tender, No Biopsy scars, no Dimpling - Left, No Inflammation, No Lumpectomy scars, No Mastectomy scars, No Peau d' Orange. Breast - Right-Symmetric, Non Tender, No  Biopsy scars, no Dimpling - Right, No Inflammation, No Lumpectomy scars, No Mastectomy scars, No Peau d' Orange. Breast Lump-No Palpable Breast Mass.  Neurologic Neurologic evaluation reveals -alert and oriented x 3 with no impairment of recent or remote memory.  Mental Status-Normal.  Lymphatic Head & Neck  General Head & Neck Lymphatics: Bilateral - Description - Normal. Axillary  General Axillary Region: Bilateral - Description - Normal. Tenderness - Non Tender.    Assessment & Plan (Korri Ask A. Meoshia Billing MD; 05/09/2020 3:48 PM)  BREAST CANCER, STAGE 1, LEFT (C50.912) Impression: pt has opted for left breast lumpectomy SLN mapping  Risk of lumpectomy include bleeding, infection, seroma, more surgery, use of seed/wire, wound care, cosmetic deformity and the need for other treatments, death , blood clots, death. Pt agrees to proceed. Risk of sentinel lymph node mapping include bleeding, infection, lymphedema, shoulder pain. stiffness, dye allergy. cosmetic deformity , blood clots, death, need for more surgery. Pt agrees to proceed.  Current Plans We discussed the staging and pathophysiology of breast cancer. We discussed all of the different options for treatment for breast cancer including surgery, chemotherapy, radiation therapy, Herceptin, and antiestrogen therapy. We discussed a sentinel lymph node biopsy as she does not appear to having lymph node involvement right now. We discussed the performance of that with injection of radioactive tracer and blue dye. We discussed that she would have an incision underneath her axillary hairline. We discussed that there is a bout a 10-20% chance of having a positive node with a sentinel lymph node biopsy and we will await the permanent pathology to make any other first further decisions in terms of her treatment. One of these options might be to return to the operating room to perform an axillary lymph node dissection. We discussed about a 1-2%  risk lifetime of chronic shoulder pain as well as lymphedema associated with a sentinel lymph node biopsy. We discussed the options for treatment of the breast cancer which included lumpectomy versus a mastectomy. We discussed the performance of the lumpectomy with a wire placement. We discussed a 10-20% chance of a positive margin requiring reexcision in the operating room. We also discussed that she may need radiation therapy or antiestrogen therapy or both if she undergoes lumpectomy. We discussed the mastectomy and the postoperative care for that as well. We discussed that there is no difference in her survival whether she undergoes lumpectomy with radiation therapy or antiestrogen therapy versus a mastectomy. There is a slight difference in the local recurrence rate being 3-5% with lumpectomy and about 1% with a mastectomy. We discussed the risks of operation including bleeding, infection, possible reoperation. She understands her further therapy will be based on what her stages at the time of her operation.  Pt Education - [facility]b breast cancer surgery: discussed with patient and provided information. Pt Education - ABC (After Breast Cancer) Class Info: discussed with patient and provided information.

## 2020-05-09 NOTE — Progress Notes (Signed)
Radiation Oncology         [phone] ________________________________  Initial Outpatient Consultation  Name: [name] MRN: [id number]  Date: 05/09/2020  DOB: December 10, 1960  NO:MVEHM, Modena Morrow, DO  [name], MD   REFERRING PHYSICIAN: [name], MD  DIAGNOSIS:    ICD-10-CM   1. Malignant neoplasm of lower-outer quadrant of left breast of female, estrogen receptor positive ([place])  C50.512    Z17.0     Cancer Staging Malignant neoplasm of lower-outer quadrant of left breast of female, estrogen receptor positive ([place]) Staging form: Breast, AJCC 8th Edition - Clinical stage from 05/09/2020: Stage IA (cT1a, cN0, cM0, G1, ER+, PR+, HER2-) - Signed by Nicholas Lose, MD on 05/09/2020 Stage prefix: Initial diagnosis   CHIEF COMPLAINT: Here to discuss management of left breast cancer  HISTORY OF PRESENT ILLNESS::[name] is a 60 y.o. female who presented with left breast abnormality on the following imaging: bilateral screening mammogram on the date of 04/19/2020. No symptoms were reported at that time. Ultrasound of the left breast on 04/30/2020 revealed a 3 mm indeterminate mass in the 6 o'clock position. There was no left axillary adenopathy. Biopsy on the date of 04/30/2020 showed invasive ductal carcinoma with DCIS. ER status: 95% strong; PR status: 90% strong; Her2 status: negative; Grade: 2.  She is in her usual state of health.  She is here with her husband today.  She does not work outside the home.  PREVIOUS RADIATION THERAPY: No  PAST MEDICAL HISTORY:  has a past medical history of Abnormal Pap smear, Chronic kidney disease, Gilbert's syndrome, History of kidney stones, Infertility, PONV (postoperative nausea and vomiting), and Vaginitis, atrophic.    PAST SURGICAL HISTORY: Past Surgical History:  Procedure Laterality Date  . CESAREAN SECTION  12/17/1993   [facility]  . DIAGNOSTIC LAPAROSCOPY    . HYSTEROSCOPY WITH D & C N/A 03/09/2014   Procedure: DILATATION  AND CURETTAGE /HYSTEROSCOPY;  Surgeon: [name], MD;  Location: [facility];  Service: Gynecology;  Laterality: N/A;  . kidney stone removal  2010  . monalisa      FAMILY HISTORY: family history includes Colon cancer in her maternal grandfather; Diabetes in her mother; Hypertension in her father.  SOCIAL HISTORY:  reports that she has never smoked. She has never used smokeless tobacco. She reports that she does not drink alcohol and does not use drugs.  ALLERGIES: Patient has no known allergies.  MEDICATIONS:  Current Outpatient Medications  Medication Sig Dispense Refill  . Ascorbic Acid (VITAMIN C) 100 MG tablet Take 100 mg by mouth daily.    Marland Kitchen ibuprofen (ADVIL,MOTRIN) 200 MG tablet Take 200 mg by mouth.    . Multiple Vitamins-Minerals (ZINC PO) Take 50 mg by mouth daily.    . Vitamin D, Ergocalciferol, (DRISDOL) 50000 units CAPS capsule Take 1 capsule (50,000 Units total) by mouth every 7 (seven) days. 8 capsule 0  . XIIDRA 5 % SOLN      No current facility-administered medications for this encounter.    REVIEW OF SYSTEMS: As above   PHYSICAL EXAM:  vitals were not taken for this visit.   General: Alert and oriented, in no acute distress  Heart is regular in rate and rhythm with no murmurs  chest: Clear to auscultation bilaterally, with no rhonchi, wheezes, or rales. Psychiatric: Judgment and insight are intact. Affect is appropriate. Breasts: No palpable masses appreciated in the breasts or axillae bilaterally.    ECOG = 0  0 -  Asymptomatic (Fully active, able to carry on all predisease activities without restriction)  1 - Symptomatic but completely ambulatory (Restricted in physically strenuous activity but ambulatory and able to carry out work of a light or sedentary nature. For example, light housework, office work)  2 - Symptomatic, <50% in bed during the day (Ambulatory and capable of all self care but unable to carry out any work activities. Up and about more than 50%  of waking hours)  3 - Symptomatic, >50% in bed, but not bedbound (Capable of only limited self-care, confined to bed or chair 50% or more of waking hours)  4 - Bedbound (Completely disabled. Cannot carry on any self-care. Totally confined to bed or chair)  5 - Death   Santiago Glad MM, Creech RH, Tormey DC, et al. [phone]). "Toxicity and response criteria of the [facility]". Am. Evlyn Clines. Oncol. 5 (6): 649-55   LABORATORY DATA:  Lab Results  Component Value Date   WBC 7.3 05/09/2020   HGB 14.6 05/09/2020   HCT 44.3 05/09/2020   MCV 89.9 05/09/2020   PLT 272 05/09/2020   CMP     Component Value Date/Time   NA 142 05/09/2020 1222   K 3.9 05/09/2020 1222   CL 106 05/09/2020 1222   CO2 27 05/09/2020 1222   GLUCOSE 95 05/09/2020 1222   BUN 18 05/09/2020 1222   CREATININE 0.82 05/09/2020 1222   CREATININE 0.90 03/13/2016 0815   CALCIUM 9.5 05/09/2020 1222   PROT 7.1 05/09/2020 1222   ALBUMIN 4.1 05/09/2020 1222   AST 18 05/09/2020 1222   ALT 15 05/09/2020 1222   ALKPHOS 74 05/09/2020 1222   BILITOT 1.5 (H) 05/09/2020 1222   GFRNONAA >60 05/09/2020 1222         RADIOGRAPHY: US BREAST LTD UNI LEFT INC AXILLA  Result Date: 04/30/2020 CLINICAL DATA:  Possible mass in the posterior, inferior left breast in the craniocaudal projection of a recent screening mammogram. EXAM: DIGITAL DIAGNOSTIC UNILATERAL LEFT MAMMOGRAM WITH TOMOSYNTHESIS AND CAD; ULTRASOUND LEFT BREAST LIMITED TECHNIQUE: Left digital diagnostic mammography and breast tomosynthesis was performed. The images were evaluated with computer-aided detection.; Targeted ultrasound examination of the left breast was performed COMPARISON:  Previous exam(s). ACR Breast Density Category b: There are scattered areas of fibroglandular density. FINDINGS: 3D tomographic and 2D generated true lateral and spot compression craniocaudal images of the left breast confirm a small, oval, partially circumscribed and partially  obscured mass in the posterior 6 o'clock position of the left breast. On physical exam, no mass palpable in the inferior left breast or left axilla. Targeted ultrasound is performed, showing a 3 x 3 x 3 mm mildly irregular, hypoechoic mass in the 6 o'clock position of the left breast, 5 cm from the nipple. This corresponds to the mammographic mass. Ultrasound of the left axilla demonstrated no axillary adenopathy. IMPRESSION: 3 mm indeterminate mass in the 6 o'clock position of the left breast. RECOMMENDATION: Ultrasound-guided core needle biopsy of the 3 mm mass in the 6 o'clock position of the left breast. This has been discussed with the patient and scheduled at 10:30 a.m. today. I have discussed the findings and recommendations with the patient. If applicable, a reminder letter will be sent to the patient regarding the next appointment. BI-RADS CATEGORY  4: Suspicious. Electronically Signed   By: Beckie Salts M.D.   On: 04/30/2020 09:40   MM DIAG BREAST TOMO UNI LEFT  Result Date: 04/30/2020 CLINICAL DATA:  Possible mass in the posterior,  inferior left breast in the craniocaudal projection of a recent screening mammogram. EXAM: DIGITAL DIAGNOSTIC UNILATERAL LEFT MAMMOGRAM WITH TOMOSYNTHESIS AND CAD; ULTRASOUND LEFT BREAST LIMITED TECHNIQUE: Left digital diagnostic mammography and breast tomosynthesis was performed. The images were evaluated with computer-aided detection.; Targeted ultrasound examination of the left breast was performed COMPARISON:  Previous exam(s). ACR Breast Density Category b: There are scattered areas of fibroglandular density. FINDINGS: 3D tomographic and 2D generated true lateral and spot compression craniocaudal images of the left breast confirm a small, oval, partially circumscribed and partially obscured mass in the posterior 6 o'clock position of the left breast. On physical exam, no mass palpable in the inferior left breast or left axilla. Targeted ultrasound is performed, showing  a 3 x 3 x 3 mm mildly irregular, hypoechoic mass in the 6 o'clock position of the left breast, 5 cm from the nipple. This corresponds to the mammographic mass. Ultrasound of the left axilla demonstrated no axillary adenopathy. IMPRESSION: 3 mm indeterminate mass in the 6 o'clock position of the left breast. RECOMMENDATION: Ultrasound-guided core needle biopsy of the 3 mm mass in the 6 o'clock position of the left breast. This has been discussed with the patient and scheduled at 10:30 a.m. today. I have discussed the findings and recommendations with the patient. If applicable, a reminder letter will be sent to the patient regarding the next appointment. BI-RADS CATEGORY  4: Suspicious. Electronically Signed   By: Claudie Revering M.D.   On: 04/30/2020 09:40   MM 3D SCREEN BREAST BILATERAL  Result Date: 04/24/2020 CLINICAL DATA:  Screening. EXAM: DIGITAL SCREENING BILATERAL MAMMOGRAM WITH TOMOSYNTHESIS AND CAD TECHNIQUE: Bilateral screening digital craniocaudal and mediolateral oblique mammograms were obtained. Bilateral screening digital breast tomosynthesis was performed. The images were evaluated with computer-aided detection. COMPARISON:  Prior films ACR Breast Density Category b: There are scattered areas of fibroglandular density. FINDINGS: In the left breast, a possible asymmetry warrants further evaluation. In the right breast, no findings suspicious for malignancy. IMPRESSION: Further evaluation is suggested for possible asymmetry in the left breast. RECOMMENDATION: Diagnostic mammogram and possibly ultrasound of the left breast. (Code:[id number]) The patient will be contacted regarding the findings, and additional imaging will be scheduled. BI-RADS CATEGORY  0: Incomplete. Need additional imaging evaluation and/or prior mammograms for comparison. Electronically Signed   By: Abelardo Diesel M.D.   On: 04/24/2020 13:21   MM CLIP PLACEMENT LEFT  Result Date: 04/30/2020 CLINICAL DATA:  Status post  ultrasound-guided core needle biopsy of a small left breast mass. Evaluate post biopsy marker clip placement. EXAM: DIAGNOSTIC LEFT MAMMOGRAM POST ULTRASOUND BIOPSY COMPARISON:  Previous exam(s). FINDINGS: Mammographic images were obtained following ultrasound guided biopsy of a small 6 o'clock position left breast mass. The biopsy marking clip is in expected position at the site of biopsy. IMPRESSION: Appropriate positioning of the ribbon shaped biopsy marking clip at the site of biopsy in the inferior left breast at the expected location of the mass, which is no longer defined. Final Assessment: Post Procedure Mammograms for Marker Placement Electronically Signed   By: Lajean Manes M.D.   On: 04/30/2020 11:11   [place] LT BREAST BX W LOC DEV 1ST LESION IMG BX SPEC US GUIDE  Addendum Date: 05/02/2020   ADDENDUM REPORT: 05/02/2020 12:01 ADDENDUM: Pathology revealed GRADE II INVASIVE DUCTAL CARCINOMA, DUCTAL CARCINOMA IN SITU of the Left breast, 6 o'clock, 5cmfn. This was found to be concordant by Dr. Lajean Manes. Pathology results were discussed with the patient by telephone. The patient reported  doing well after the biopsy with tenderness at the site. Post biopsy instructions and care were reviewed and questions were answered. The patient was encouraged to call [facility] for any additional concerns. My direct phone number was provided. The patient was referred to [facility] on May 09, 2020. Pathology results reported by Terie Purser, RN on 05/02/2020. Electronically Signed   By: Lajean Manes M.D.   On: 05/02/2020 12:01   Result Date: 05/02/2020 CLINICAL DATA:  Patient presents for ultrasound-guided core needle biopsy of a 3 mm left breast mass. EXAM: ULTRASOUND GUIDED LEFT BREAST CORE NEEDLE BIOPSY COMPARISON:  Previous exam(s). PROCEDURE: I met with the patient and we discussed the procedure of  ultrasound-guided biopsy, including benefits and alternatives. We discussed the high likelihood of a successful procedure. We discussed the risks of the procedure, including infection, bleeding, tissue injury, clip migration, and inadequate sampling. Informed written consent was given. The usual time-out protocol was performed immediately prior to the procedure. Lesion quadrant: Lower outer quadrant: Near 6 o'clock. Using sterile technique and 1% Lidocaine as local anesthetic, under direct ultrasound visualization, a 14 gauge spring-loaded device was used to perform biopsy of the 3 mm hypoechoic, possibly cystic, mass using a inferomedial approach. At the conclusion of the procedure ribbon shaped tissue marker clip was deployed into the biopsy cavity. Follow up 2 view mammogram was performed and dictated separately. IMPRESSION: Ultrasound guided biopsy of a small left breast mass. No apparent complications. Electronically Signed: By: Lajean Manes M.D. On: 04/30/2020 11:01      IMPRESSION/PLAN: Left breast cancer  It was a pleasure meeting the patient today. We discussed the risks, benefits, and side effects of radiotherapy. I recommend radiotherapy to the left breast to reduce her risk of locoregional recurrence by 2/3.  We discussed that radiation would take approximately 4 weeks to complete and that I would give the patient a few weeks to heal following surgery before starting treatment planning.   We spoke about acute effects including skin irritation and fatigue as well as much less common late effects including internal organ injury or irritation. We spoke about the latest technology that is used to minimize the risk of late effects for patients undergoing radiotherapy to the breast or chest wall. No guarantees of treatment were given. The patient is enthusiastic about proceeding with treatment. I look forward to participating in the patient's care.  I will await her referral back to me for postoperative  follow-up and eventual CT simulation/treatment planning.  We discussed measures to reduce the risk of infection during the COVID-19 pandemic.  She has not yet been vaccinated.  We had a discussion about the pros and cons of vaccination and the risks of catching Covid.  I explained why I strongly recommend that she undergo vaccination.  At this time she does not wish to do so but she does have information about how to contact my nurse in case she changes her mind.  We can provide the [place] shot here.  On date of service, in total, I spent 45 minutes on this encounter. Patient was seen in person.   __________________________________________   Eppie Gibson, MD  This document serves as a record of services personally performed by Eppie Gibson, MD. It was created on his behalf by Clerance Lav, a trained medical scribe. The creation of this record is based on the scribe's personal observations and the provider's statements to  them. This document has been checked and approved by the attending provider.  

## 2020-05-09 NOTE — Therapy (Signed)
[place], [place], [address] Phone: [phone]   Fax:  [phone]  Physical Therapy Evaluation  Patient Details  Name: [name] MRN: [id number] Date of Birth: Nov 23, 1960 Referring Provider (PT): Dr. [name]   Encounter Date: 05/09/2020   PT End of Session - 05/09/20 1849    Visit Number 1    Number of Visits 2    Date for PT Re-Evaluation 07/04/20    PT Start Time 1335    PT Stop Time 1350   Also saw pt from 1440-1457 for a total of 32 minutes   PT Time Calculation (min) 15 min    Activity Tolerance Patient tolerated treatment well    Behavior During Therapy [facility] for tasks assessed/performed           Past Medical History:  Diagnosis Date  . Abnormal Pap smear    ASCUS/LGSIL  . 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 Gilbert's syndrome    pt denies problems  . History of kidney stones   . Infertility    Seen by RE @ [facility]  . PONV (postoperative nausea and vomiting)   . Vaginitis, atrophic     Past Surgical History:  Procedure Laterality Date  . CESAREAN SECTION  12/17/1993   Elvina Sidle  . DIAGNOSTIC LAPAROSCOPY    . HYSTEROSCOPY WITH D & C N/A 03/09/2014   Procedure: DILATATION AND CURETTAGE /HYSTEROSCOPY;  Surgeon: [name], MD;  Location: [facility];  Service: Gynecology;  Laterality: N/A;  . kidney stone removal  2010  . monalisa      There were no vitals filed for this visit.    Subjective Assessment - 05/09/20 1836    Subjective Patient reports she is here today to be seen by her medical team for her newly diagnosed left breast cancer.    Patient is accompained by: Family member    Pertinent History Patient was diagnosed on 04/19/2020 with left grade II invasive ductal carcinoma breast cancer. It measures 3 mm and is located in the lower outer quadrant. It is ER/PR positive and HER2 negative with a Ki67 of 10%.    Patient Stated Goals Reduce  lymphedema risk and learn post op shoulder ROM HEP    Currently in Pain? No/denies              [facility] PT Assessment - 05/09/20 0001      Assessment   Medical Diagnosis Left breast cancer    Referring Provider (PT) Dr. Marcello Moores [place]    Onset Date/Surgical Date 04/19/20    Hand Dominance Right    Prior Therapy none      Precautions   Precautions Other (comment)    Precaution Comments active cancer      Restrictions   Weight Bearing Restrictions No      Balance Screen   Has the patient fallen in the past 6 months No    Has the patient had a decrease in activity level because of a fear of falling?  No    Is the patient reluctant to leave their home because of a fear of falling?  No      Home Environment   Living Environment Private residence    Living Arrangements Spouse/significant other    Available Help at Discharge Family      Prior Function   Level of Independence Independent    Vocation Unemployed    Leisure She walks  and rides a stationary bike for 1 hour daily      Cognition   Overall Cognitive Status Within Functional Limits for tasks assessed      Posture/Postural Control   Posture/Postural Control Postural limitations    Postural Limitations Rounded Shoulders;Forward head      ROM / Strength   AROM / PROM / Strength AROM;Strength      AROM   Overall AROM Comments Cervical AROM is WNL    AROM Assessment Site Shoulder    Right/Left Shoulder Right;Left    Right Shoulder Extension 51 Degrees    Right Shoulder Flexion 157 Degrees    Right Shoulder ABduction 160 Degrees    Right Shoulder Internal Rotation 76 Degrees    Right Shoulder External Rotation 90 Degrees    Left Shoulder Extension 53 Degrees    Left Shoulder Flexion 147 Degrees    Left Shoulder ABduction 162 Degrees    Left Shoulder Internal Rotation 65 Degrees    Left Shoulder External Rotation 90 Degrees      Strength   Overall Strength Within functional limits for tasks performed              LYMPHEDEMA/ONCOLOGY QUESTIONNAIRE - 05/09/20 0001      Type   Cancer Type Left breast cancer      Lymphedema Assessments   Lymphedema Assessments Upper extremities      Right Upper Extremity Lymphedema   10 cm Proximal to Olecranon Process 26.8 cm    Olecranon Process 22.9 cm    10 cm Proximal to Ulnar Styloid Process 20.5 cm    Just Proximal to Ulnar Styloid Process 14.8 cm    Across Hand at [employer] 18.5 cm    At [place] of 2nd Digit 5.9 cm      Left Upper Extremity Lymphedema   10 cm Proximal to Olecranon Process 26.2 cm    Olecranon Process 22.9 cm    10 cm Proximal to Ulnar Styloid Process 19.6 cm    Just Proximal to Ulnar Styloid Process 14.4 cm    Across Hand at [employer] 18.1 cm    At [place] of 2nd Digit 5.7 cm           L-DEX FLOWSHEETS - 05/09/20 1800      L-DEX LYMPHEDEMA SCREENING   Measurement Type Unilateral    L-DEX MEASUREMENT EXTREMITY Upper Extremity    POSITION  Standing    DOMINANT SIDE Right    At Risk Side Left    BASELINE SCORE (UNILATERAL) -4.1           The patient was assessed using the L-Dex machine today to produce a lymphedema index baseline score. The patient will be reassessed on a regular basis (typically every 3 months) to obtain new L-Dex scores. If the score is > 6.5 points away from his/her baseline score indicating onset of subclinical lymphedema, it will be recommended to wear a compression garment for 4 weeks, 12 hours per day and then be reassessed. If the score continues to be > 6.5 points from baseline at reassessment, we will initiate lymphedema treatment. Assessing in this manner has a 95% rate of preventing clinically significant lymphedema.      Katina Dung - 05/09/20 0001    Open a tight or new jar Mild difficulty    Do heavy household chores (wash walls, wash floors) No difficulty    Carry a shopping bag or briefcase No difficulty    Wash your back  No difficulty    Use a knife to cut food No difficulty     Recreational activities in which you take some force or impact through your arm, shoulder, or hand (golf, hammering, tennis) No difficulty    During the past week, to what extent has your arm, shoulder or hand problem interfered with your normal social activities with family, friends, neighbors, or groups? Not at all    During the past week, to what extent has your arm, shoulder or hand problem limited your work or other regular daily activities Not at all    Arm, shoulder, or hand pain. None    Tingling (pins and needles) in your arm, shoulder, or hand None    Difficulty Sleeping No difficulty    DASH Score 2.27 %            Objective measurements completed on examination: See above findings.            Patient was instructed today in a home exercise program today for post op shoulder range of motion. These included active assist shoulder flexion in sitting, scapular retraction, wall walking with shoulder abduction, and hands behind head external rotation.  She was encouraged to do these twice a day, holding 3 seconds and repeating 5 times when permitted by her physician.       PT Education - 05/09/20 1848    Education Details Lymphedema risk reduction and post op shoulder ROM HEP    Person(s) Educated Patient;Other (comment);Spouse   disabled husband and sister   Methods Explanation;Demonstration;Handout    Comprehension Verbalized understanding;Returned demonstration               PT Long Term Goals - 05/09/20 1852      PT LONG TERM GOAL #1   Title Patient will demonstrate she has regained full shoulder ROM and function post operatively compared to baselines.    Time 8    Period Weeks    Status New    Target Date 07/04/20           Breast Clinic Goals - 05/09/20 1852      Patient will be able to verbalize understanding of pertinent lymphedema risk reduction practices relevant to her diagnosis specifically related to skin care.   Time 1    Period Days     Status Achieved      Patient will be able to return demonstrate and/or verbalize understanding of the post-op home exercise program related to regaining shoulder range of motion.   Time 1    Period Days    Status Achieved      Patient will be able to verbalize understanding of the importance of attending the postoperative After Breast Cancer Class for further lymphedema risk reduction education and therapeutic exercise.   Time 1    Period Days    Status Achieved                 Plan - 05/09/20 1849    Clinical Impression Statement Patient was diagnosed on 04/19/2020 with left grade II invasive ductal carcinoma breast cancer. It measures 3 mm and is located in the lower outer quadrant. It is ER/PR positive and HER2 negative with a Ki67 of 10%. Her multidisciplinary medical team met prior to her assessments to determine a recommended treatment plan. She is planning to have a left lumpectomy and sentinel node biopsy followed by radiation and anti-estrogen therapy.    Stability/Clinical Decision Making Stable/Uncomplicated    Clinical Decision  Making Low    Rehab Potential Excellent    PT Frequency --   Eval and 1 f/u visit   PT Treatment/Interventions ADLs/Self Care Home Management;Patient/family education    PT Next Visit Plan Will reassess 3-4 weeks post op to determine needs    PT Home Exercise Plan Post op shoulder ROM HEP    Consulted and Agree with Plan of Care Patient;Family member/caregiver    Family Member Consulted Husband and sister           Patient will benefit from skilled therapeutic intervention in order to improve the following deficits and impairments:  Postural dysfunction,Decreased range of motion,Decreased knowledge of precautions,Impaired UE functional use,Pain  Visit Diagnosis: Malignant neoplasm of lower-outer quadrant of left breast of female, estrogen receptor positive ([place]) - Plan: PT plan of care cert/re-cert  Abnormal posture - Plan: PT plan of  care cert/re-cert   Patient will follow up at outpatient cancer rehab 3-4 weeks following surgery.  If the patient requires physical therapy at that time, a specific plan will be dictated and sent to the referring physician for approval. The patient was educated today on appropriate basic range of motion exercises to begin post operatively and the importance of attending the After Breast Cancer class following surgery.  Patient was educated today on lymphedema risk reduction practices as it pertains to recommendations that will benefit the patient immediately following surgery.  She verbalized good understanding.      Problem List Patient Active Problem List   Diagnosis Date Noted  . Malignant neoplasm of lower-outer quadrant of left breast of female, estrogen receptor positive ([place]) 05/04/2020  . Postmenopausal bleeding 11/15/2013  . Hx of abnormal cervical Pap smear 09/17/2010  . Endometrial hyperplasia, simple 09/17/2010   Annia Friendly, PT 05/09/20 6:54 PM  [place], [place], [address] Phone: [phone]   Fax:  [phone]  Name: ROYELLE HINCHMAN MRN: [id number] Date of Birth: Sep 06, 1960

## 2020-05-09 NOTE — Patient Instructions (Signed)

## 2020-05-09 NOTE — H&P (View-Only) (Signed)
[name] Appointment: 05/09/2020 1:00 PM Location: [facility] Patient #: [id number] DOB: 08-30-1960 Undefined / Language: [name] / Race: White Female  History of Present Illness Marcello Moores A. Nahdia Doucet MD; 05/09/2020 3:47 PM) Patient words: Pt presents to the [facility] mammogram mass 3 mm 6 oclock idc er pos pr pos her 2 neu neg  NO complaints of mass discharge or pain in either breasts.  The patient is a 60 year old female.   Past Surgical History Conni Slipper, RN; 05/09/2020 8:27 AM) Breast Biopsy Left. Cesarean Section - 1 Oral Surgery  Diagnostic Studies History Conni Slipper, RN; 05/09/2020 8:27 AM) Colonoscopy 5-10 years ago Mammogram within last year Pap Smear 1-5 years ago  Medication History Conni Slipper, RN; 05/09/2020 8:26 AM) Medications Reconciled  Social History Conni Slipper, RN; 05/09/2020 8:27 AM) Caffeine use Carbonated beverages, Tea. No alcohol use Tobacco use Never smoker.  Family History Conni Slipper, RN; 05/09/2020 8:27 AM) Colon Cancer Family Members In General. Diabetes Mellitus Mother. Heart disease in female family member before age 51 Hypertension Father, Mother. Kidney Disease Father. Thyroid problems Mother.  Pregnancy / Birth History Conni Slipper, RN; 05/09/2020 8:27 AM) Age at menarche 37 years. Age of menopause <45 Gravida 2 Maternal age 50-35 Para 22  Other Problems Conni Slipper, RN; 05/09/2020 8:27 AM) Anxiety Disorder Breast Cancer Hypercholesterolemia     Review of Systems Conni Slipper RN; 05/09/2020 8:27 AM) General Not Present- Appetite Loss, Chills, Fatigue, Fever, Night Sweats, Weight Gain and Weight Loss. Skin Not Present- Change in Wart/Mole, Dryness, Hives, Jaundice, New Lesions, Non-Healing Wounds, Rash and Ulcer. HEENT Present- Wears glasses/contact lenses. Not Present- Earache, Hearing Loss, Hoarseness, Nose Bleed, Oral Ulcers, Ringing in the Ears, Seasonal Allergies, Sinus Pain, Sore Throat, Visual  Disturbances and Yellow Eyes. Respiratory Not Present- Bloody sputum, Chronic Cough, Difficulty Breathing, Snoring and Wheezing. Cardiovascular Not Present- Chest Pain, Difficulty Breathing Lying Down, Leg Cramps, Palpitations, Rapid Heart Rate, Shortness of Breath and Swelling of Extremities. Gastrointestinal Not Present- Abdominal Pain, Bloating, Bloody Stool, Change in Bowel Habits, Chronic diarrhea, Constipation, Difficulty Swallowing, Excessive gas, Gets full quickly at meals, Hemorrhoids, Indigestion, Nausea, Rectal Pain and Vomiting. Female Genitourinary Not Present- Frequency, Nocturia, Painful Urination, Pelvic Pain and Urgency. Musculoskeletal Not Present- Back Pain, Joint Pain, Joint Stiffness, Muscle Pain, Muscle Weakness and Swelling of Extremities. Neurological Not Present- Decreased Memory, Fainting, Headaches, Numbness, Seizures, Tingling, Tremor, Trouble walking and Weakness. Psychiatric Present- Anxiety. Not Present- Bipolar, Change in Sleep Pattern, Depression, Fearful and Frequent crying. Endocrine Not Present- Cold Intolerance, Excessive Hunger, Hair Changes, Heat Intolerance, Hot flashes and New Diabetes. Hematology Not Present- Blood Thinners, Easy Bruising, Excessive bleeding, Gland problems, HIV and Persistent Infections.   Physical Exam (Nyima Vanacker A. Mana Morison MD; 05/09/2020 3:47 PM)  General Mental Status-Alert. General Appearance-Consistent with stated age. Hydration-Well hydrated. Voice-Normal.  Chest and Lung Exam Chest and lung exam reveals -quiet, even and easy respiratory effort with no use of accessory muscles and on auscultation, normal breath sounds, no adventitious sounds and normal vocal resonance. Inspection Chest Wall - Normal. Back - normal.  Breast Breast - Left-Symmetric, Non Tender, No Biopsy scars, no Dimpling - Left, No Inflammation, No Lumpectomy scars, No Mastectomy scars, No Peau d' Orange. Breast - Right-Symmetric, Non Tender, No  Biopsy scars, no Dimpling - Right, No Inflammation, No Lumpectomy scars, No Mastectomy scars, No Peau d' Orange. Breast Lump-No Palpable Breast Mass.  Neurologic Neurologic evaluation reveals -alert and oriented x 3 with no impairment of recent or remote memory.  Mental Status-Normal.  Lymphatic Head & Neck  General Head & Neck Lymphatics: Bilateral - Description - Normal. Axillary  General Axillary Region: Bilateral - Description - Normal. Tenderness - Non Tender.    Assessment & Plan (Archana Eckman A. Drake Landing MD; 05/09/2020 3:48 PM)  BREAST CANCER, STAGE 1, LEFT (C50.912) Impression: pt has opted for left breast lumpectomy SLN mapping  Risk of lumpectomy include bleeding, infection, seroma, more surgery, use of seed/wire, wound care, cosmetic deformity and the need for other treatments, death , blood clots, death. Pt agrees to proceed. Risk of sentinel lymph node mapping include bleeding, infection, lymphedema, shoulder pain. stiffness, dye allergy. cosmetic deformity , blood clots, death, need for more surgery. Pt agrees to proceed.  Current Plans We discussed the staging and pathophysiology of breast cancer. We discussed all of the different options for treatment for breast cancer including surgery, chemotherapy, radiation therapy, Herceptin, and antiestrogen therapy. We discussed a sentinel lymph node biopsy as she does not appear to having lymph node involvement right now. We discussed the performance of that with injection of radioactive tracer and blue dye. We discussed that she would have an incision underneath her axillary hairline. We discussed that there is a bout a 10-20% chance of having a positive node with a sentinel lymph node biopsy and we will await the permanent pathology to make any other first further decisions in terms of her treatment. One of these options might be to return to the operating room to perform an axillary lymph node dissection. We discussed about a 1-2%  risk lifetime of chronic shoulder pain as well as lymphedema associated with a sentinel lymph node biopsy. We discussed the options for treatment of the breast cancer which included lumpectomy versus a mastectomy. We discussed the performance of the lumpectomy with a wire placement. We discussed a 10-20% chance of a positive margin requiring reexcision in the operating room. We also discussed that she may need radiation therapy or antiestrogen therapy or both if she undergoes lumpectomy. We discussed the mastectomy and the postoperative care for that as well. We discussed that there is no difference in her survival whether she undergoes lumpectomy with radiation therapy or antiestrogen therapy versus a mastectomy. There is a slight difference in the local recurrence rate being 3-5% with lumpectomy and about 1% with a mastectomy. We discussed the risks of operation including bleeding, infection, possible reoperation. She understands her further therapy will be based on what her stages at the time of her operation.  Pt Education - [facility] breast cancer surgery: discussed with patient and provided information. Pt Education - ABC (After Breast Cancer) Class Info: discussed with patient and provided information.

## 2020-05-11 ENCOUNTER — Other Ambulatory Visit: Payer: Self-pay | Admitting: Surgery

## 2020-05-11 ENCOUNTER — Other Ambulatory Visit: Payer: Self-pay | Admitting: *Deleted

## 2020-05-11 ENCOUNTER — Encounter: Payer: Self-pay | Admitting: Physical Therapy

## 2020-05-11 ENCOUNTER — Ambulatory Visit: Payer: Self-pay | Admitting: Surgery

## 2020-05-11 ENCOUNTER — Telehealth: Payer: Self-pay | Admitting: Hematology and Oncology

## 2020-05-11 DIAGNOSIS — C50912 Malignant neoplasm of unspecified site of left female breast: Secondary | ICD-10-CM

## 2020-05-11 DIAGNOSIS — Z170 Estrogen receptor positive status [ER+]: Secondary | ICD-10-CM

## 2020-05-11 DIAGNOSIS — C50512 Malignant neoplasm of lower-outer quadrant of left female breast: Secondary | ICD-10-CM

## 2020-05-11 NOTE — Telephone Encounter (Signed)
Scheduled appt per 3/11 sch msg. Pt. aware

## 2020-05-17 ENCOUNTER — Encounter: Payer: Self-pay | Admitting: *Deleted

## 2020-05-17 ENCOUNTER — Telehealth: Payer: Self-pay | Admitting: *Deleted

## 2020-05-17 NOTE — Telephone Encounter (Signed)
Spoke with patient to follow up from [facility] 3/9 and assess navigation needs. She states she was originally supposed to have seed placed by Dr. Autumn Patty as she requested on 3/28 but was called and r/s to 3/29 with a different MD  prior to her sx.  She was upset by this and when she called [facility] she kept getting different answers and what she felt like was incorrect information. Contacted [facility] and they will be contacting patient to clarify. Patient verbalized understanding.

## 2020-05-22 NOTE — Progress Notes (Signed)
Surgical Instructions    Your procedure is scheduled on Tuesday, March 29th  Report to [facility] Main Entrance "A" at 09:30 A.M., then check in with the Admitting office.  Call this number if you have problems the morning of surgery:  [phone]   If you have any questions prior to your surgery date call [phone]: Open Monday-Friday 8am-4pm    Remember:  Do not eat after midnight the night before your surgery  You may drink clear liquids until 08:30 the morning of your surgery.   Clear liquids allowed are: Water, Non-Citrus Juices (without pulp), Carbonated Beverages, Clear Tea, Black Coffee Only, and Gatorade.    Take these medicines the morning of surgery   XIIDRA eye drops   As of today, STOP taking any Aspirin (unless otherwise instructed by your surgeon) Aleve, Naproxen, Ibuprofen, Motrin, Advil, Goody's, BC's, all herbal medications, fish oil, and all vitamins.                     Do not wear jewelry, make up, or nail polish            Do not wear lotions, powders, perfumes, or deodorant.            Do not shave 48 hours prior to surgery.              Do not bring valuables to the hospital.            [facility] is not responsible for any belongings or valuables.  Do NOT Smoke (Tobacco/Vaping) or drink Alcohol 24 hours prior to your procedure If you use a CPAP at night, you may bring all equipment for your overnight stay.   Contacts, glasses, dentures or bridgework may not be worn into surgery, please bring cases for these belongings   For patients admitted to the hospital, discharge time will be determined by your treatment team.   Patients discharged the day of surgery will not be allowed to drive home, and someone needs to stay with them for 24 hours.    Special instructions:   [facility]- Preparing For Surgery  Before surgery, you can play an important role. Because skin is not sterile, your skin needs to be as free of germs as possible. You can reduce  the number of germs on your skin by washing with CHG (chlorahexidine gluconate) Soap before surgery.  CHG is an antiseptic cleaner which kills germs and bonds with the skin to continue killing germs even after washing.    Oral Hygiene is also important to reduce your risk of infection.  Remember - BRUSH YOUR TEETH THE MORNING OF SURGERY WITH YOUR REGULAR TOOTHPASTE  Please do not use if you have an allergy to CHG or antibacterial soaps. If your skin becomes reddened/irritated stop using the CHG.  Do not shave (including legs and underarms) for at least 48 hours prior to first CHG shower. It is OK to shave your face.  Please follow these instructions carefully.   1. Shower the NIGHT BEFORE SURGERY and the MORNING OF SURGERY  2. If you chose to wash your hair, wash your hair first as usual with your normal shampoo.  3. After you shampoo, rinse your hair and body thoroughly to remove the shampoo.  4. Use CHG Soap as you would any other liquid soap. You can apply CHG directly to the skin and wash gently with a scrungie or a clean washcloth.   5. Apply the CHG Soap to  your body ONLY FROM THE NECK DOWN.  Do not use on open wounds or open sores. Avoid contact with your eyes, ears, mouth and genitals (private parts). Wash Face and genitals (private parts)  with your normal soap.   6. Wash thoroughly, paying special attention to the area where your surgery will be performed.  7. Thoroughly rinse your body with warm water from the neck down.  8. DO NOT shower/wash with your normal soap after using and rinsing off the CHG Soap.  9. Pat yourself dry with a CLEAN TOWEL.  10. Wear CLEAN PAJAMAS to bed the night before surgery  11. Place CLEAN SHEETS on your bed the night before your surgery  12. DO NOT SLEEP WITH PETS.   Day of Surgery: Shower with CHG soap. Wear Clean/Comfortable clothing the morning of surgery Do not apply any deodorants/lotions.   Remember to brush your teeth WITH YOUR  REGULAR TOOTHPASTE.   Please read over the following fact sheets that you were given.

## 2020-05-23 ENCOUNTER — Encounter (HOSPITAL_COMMUNITY)
Admission: RE | Admit: 2020-05-23 | Discharge: 2020-05-23 | Disposition: A | Discharge status: Home / Self Care | Payer: BC Managed Care – PPO | Source: Ambulatory Visit | Attending: Surgery | Admitting: Surgery

## 2020-05-23 ENCOUNTER — Encounter (HOSPITAL_COMMUNITY): Payer: Self-pay

## 2020-05-23 ENCOUNTER — Other Ambulatory Visit: Payer: Self-pay

## 2020-05-23 DIAGNOSIS — C50912 Malignant neoplasm of unspecified site of left female breast: Secondary | ICD-10-CM

## 2020-05-23 DIAGNOSIS — Z170 Estrogen receptor positive status [ER+]: Secondary | ICD-10-CM | POA: Diagnosis not present

## 2020-05-23 DIAGNOSIS — Z01812 Encounter for preprocedural laboratory examination: Secondary | ICD-10-CM | POA: Diagnosis present

## 2020-05-23 LAB — COMPREHENSIVE METABOLIC PANEL
ALT: 16 U/L (ref 0–44)
AST: 22 U/L (ref 15–41)
Albumin: 3.9 g/dL (ref 3.5–5.0)
Alkaline Phosphatase: 62 U/L (ref 38–126)
Anion gap: 8 (ref 5–15)
BUN: 17 mg/dL (ref 6–20)
CO2: 26 mmol/L (ref 22–32)
Calcium: 9.5 mg/dL (ref 8.9–10.3)
Chloride: 105 mmol/L (ref 98–111)
Creatinine, Ser: 0.72 mg/dL (ref 0.44–1.00)
GFR, Estimated: >60 mL/min (ref >60)
Glucose, Bld: 85 mg/dL (ref 70–99)
Potassium: 3.9 mmol/L (ref 3.5–5.1)
Sodium: 139 mmol/L (ref 135–145)
Total Bilirubin: 1.8 mg/dL — ABNORMAL HIGH (ref 0.3–1.2)
Total Protein: 6.7 g/dL (ref 6.5–8.1)

## 2020-05-23 LAB — CBC WITH DIFFERENTIAL/PLATELET
Abs Immature Granulocytes: 0.02 10*3/uL (ref 0.00–0.07)
Basophils Absolute: 0 10*3/uL (ref 0.0–0.1)
Basophils Relative: 1 %
Eosinophils Absolute: 0.1 10*3/uL (ref 0.0–0.5)
Eosinophils Relative: 1 %
HCT: 44.2 % (ref 36.0–46.0)
Hemoglobin: 14.6 g/dL (ref 12.0–15.0)
Immature Granulocytes: 0 %
Lymphocytes Relative: 21 %
Lymphs Abs: 1.3 10*3/uL (ref 0.7–4.0)
MCH: 30.1 pg (ref 26.0–34.0)
MCHC: 33 g/dL (ref 30.0–36.0)
MCV: 91.1 fL (ref 80.0–100.0)
Monocytes Absolute: 0.4 10*3/uL (ref 0.1–1.0)
Monocytes Relative: 7 %
Neutro Abs: 4.2 10*3/uL (ref 1.7–7.7)
Neutrophils Relative %: 70 %
Platelets: 271 10*3/uL (ref 150–400)
RBC: 4.85 MIL/uL (ref 3.87–5.11)
RDW: 12.7 % (ref 11.5–15.5)
WBC: 6.1 10*3/uL (ref 4.0–10.5)
nRBC: 0 % (ref 0.0–0.2)

## 2020-05-23 NOTE — Progress Notes (Addendum)
PCP - Dr. Judeen Hammans [facility]- [facility] Cardiologist - denies  PPM/ICD - denies  Chest x-ray - N/A EKG - Na Stress Test - denies  ECHO - denies Cardiac Cath - denies  Sleep Study - denies CPAP - N/A  DM: denies  Blood Thinner Instructions: N/A Aspirin Instructions: N/A  ERAS Protcol - Yes PRE-SURGERY Ensure or G2- Ensure given  COVID TEST- Scheduled for 05/25/2020. Patient verbalized understanding of self-quarantine instructions, appointment time and place.  Anesthesia review: YES, seed placement scheduled for 05/29/2020 Patient stated she has white coat syndrome  Patient denies shortness of breath, fever, cough and chest pain at PAT appointment  All instructions explained to the patient, with a verbal understanding of the material. Patient agrees to go over the instructions while at home for a better understanding. Patient also instructed to self quarantine after being tested for COVID-19. The opportunity to ask questions was provided.

## 2020-05-24 ENCOUNTER — Telehealth: Payer: Self-pay | Admitting: *Deleted

## 2020-05-24 MED ORDER — LORAZEPAM 0.5 MG PO TABS: 0.5000 mg | ORAL_TABLET | Freq: Three times a day (TID) | ORAL | 0 refills | Status: DC

## 2020-05-24 NOTE — Anesthesia Preprocedure Evaluation (Addendum)
Anesthesia Evaluation  Patient identified by MRN, date of birth, ID band Patient awake    Reviewed: Allergy & Precautions, NPO status , Patient's Chart, lab work & pertinent test results  History of Anesthesia Complications (+) PONV  Airway Mallampati: I  TM Distance: >3 FB Neck ROM: Full    Dental  (+) Teeth Intact   Pulmonary neg pulmonary ROS,    Pulmonary exam normal        Cardiovascular negative cardio ROS   Rhythm:Regular Rate:Normal     Neuro/Psych negative neurological ROS  negative psych ROS   GI/Hepatic negative GI ROS, Gilbert's syndrome   Endo/Other  negative endocrine ROS  Renal/GU CRFRenal disease  negative genitourinary   Musculoskeletal Left breast cancer DCIS   Abdominal (+)  Abdomen: soft. Bowel sounds: normal.  Peds  Hematology negative hematology ROS (+)   Anesthesia Other Findings   Reproductive/Obstetrics                            Anesthesia Physical Anesthesia Plan  ASA: II  Anesthesia Plan: General and Regional   Post-op Pain Management: GA combined w/ Regional for post-op pain   Induction: Intravenous  PONV Risk Score and Plan: 4 or greater and Ondansetron, Aprepitant, Dexamethasone, Midazolam, Scopolamine patch - Pre-op and Treatment may vary due to age or medical condition  Airway Management Planned: Mask and LMA  Additional Equipment: None  Intra-op Plan:   Post-operative Plan: Extubation in OR  Informed Consent: I have reviewed the patients History and Physical, chart, labs and discussed the procedure including the risks, benefits and alternatives for the proposed anesthesia with the patient or authorized representative who has indicated his/her understanding and acceptance.     Dental advisory given  Plan Discussed with: CRNA  Anesthesia Plan Comments: (PAT note written 05/24/2020 by Myra Gianotti, PA-C. Lab Results      Component                 Value               Date                      WBC                      6.1                 05/23/2020                HGB                      14.6                05/23/2020                HCT                      44.2                05/23/2020                MCV                      91.1                05/23/2020  PLT                      271                 05/23/2020           Lab Results      Component                Value               Date                      NA                       139                 05/23/2020                K                        3.9                 05/23/2020                CO2                      26                  05/23/2020                GLUCOSE                  85                  05/23/2020                BUN                      17                  05/23/2020                CREATININE               0.72                05/23/2020                CALCIUM                  9.5                 05/23/2020                GFRNONAA                 >60                 05/23/2020          )      Anesthesia Quick Evaluation

## 2020-05-24 NOTE — Telephone Encounter (Addendum)
Received call from patient asking about the nuclear med blue dye injection prior to sx.  She has been reading and was asking if this is done while you are awake or after sedation.  Explained to her that she will be sedated when this is done and she shouldn't remember it.  She states she is very anxious about all of this and was asking if she could have something to take for anxiety for a couple of days. Informed her I would call in some lorazepam for her. She verbalized understanding.

## 2020-05-24 NOTE — Progress Notes (Signed)
Anesthesia Chart Review:  Case: [id number] Date/Time: 05/29/20 1115   Procedure: LEFT BREAST LUMPECTOMY WITH RADIOACTIVE SEED AND LEFT SENTINEL LYMPH NODE BIOPSY (Left Breast) - P   Anesthesia type: General   Pre-op diagnosis: BREAST  CANCER   Location: [facility] OR ROOM 02 / [facility]   Surgeons: [name], MD      DISCUSSION: Patient is a 60 year old female scheduled for the above procedure.  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   RSL scheduled for 05/29/2020.  Presurgical COVID-19 test is scheduled for 05/25/2020.  Anesthesia team to evaluate on the day of surgery.   VS: BP (!) 168/77   Pulse 70   Temp 36.8 C (Oral)   Resp 18   Ht 5\' 1"  (1.549 m)   Wt 59.7 kg   SpO2 100%   BMI 24.87 kg/m She reports whitecoat syndrome.   PROVIDERS: Wayland Salinas, MD is PCP Nicholas Lose, MD is HEM-ONC Eppie Gibson, MD is RAD-ONC   LABS: Labs reviewed: Acceptable for surgery.  Postmenopausal. (all labs ordered are listed, but only abnormal results are displayed)  Labs Reviewed  COMPREHENSIVE METABOLIC PANEL - Abnormal; Notable for the following components:      Result Value   Total Bilirubin 1.8 (*)    All other components within normal limits  CBC WITH DIFFERENTIAL/PLATELET    EKG: N/A   CV: N/A  Past Medical History:  Diagnosis Date  . Abnormal Pap smear    ASCUS/LGSIL  . Breast cancer ([place]) 05/2020   left breast  . 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 Gilbert's syndrome    pt denies problems  . History of kidney stones   . Infertility    Seen by RE @ [facility]  . PONV (postoperative nausea and vomiting)   . Vaginitis, atrophic     Past Surgical History:  Procedure Laterality Date  . CESAREAN SECTION  12/17/1993   [name]  . DIAGNOSTIC LAPAROSCOPY    . HYSTEROSCOPY WITH D & C N/A 03/09/2014   Procedure: DILATATION AND  CURETTAGE /HYSTEROSCOPY;  Surgeon: [name], MD;  Location: [facility];  Service: Gynecology;  Laterality: N/A;  . kidney stone removal  2010  . monalisa      MEDICATIONS: . Ascorbic Acid (VITAMIN C) 1000 MG tablet  . Cholecalciferol (VITAMIN D3) 50 MCG (2000 UT) TABS  . tretinoin (RETIN-A) 0.05 % cream  . XIIDRA 5 % SOLN  . Zinc 50 MG TABS   No current facility-administered medications for this encounter.    Myra Gianotti, PA-C Surgical Short Stay/Anesthesiology [facility] Phone [phone] [facility] Phone [phone] 05/24/2020 10:33 AM

## 2020-05-25 ENCOUNTER — Other Ambulatory Visit (HOSPITAL_COMMUNITY): Payer: BC Managed Care – PPO

## 2020-05-26 ENCOUNTER — Other Ambulatory Visit (HOSPITAL_COMMUNITY)
Admission: RE | Admit: 2020-05-26 | Discharge: 2020-05-26 | Disposition: A | Discharge status: Home / Self Care | Payer: BC Managed Care – PPO | Source: Ambulatory Visit | Attending: Surgery | Admitting: Surgery

## 2020-05-26 DIAGNOSIS — Z01812 Encounter for preprocedural laboratory examination: Secondary | ICD-10-CM | POA: Insufficient documentation

## 2020-05-26 DIAGNOSIS — Z20822 Contact with and (suspected) exposure to covid-19: Secondary | ICD-10-CM | POA: Insufficient documentation

## 2020-05-26 DIAGNOSIS — C50512 Malignant neoplasm of lower-outer quadrant of left female breast: Secondary | ICD-10-CM | POA: Diagnosis not present

## 2020-05-26 LAB — SARS CORONAVIRUS 2 (TAT 6-24 HRS): SARS Coronavirus 2: NEGATIVE

## 2020-05-29 ENCOUNTER — Ambulatory Visit (HOSPITAL_COMMUNITY): Payer: BC Managed Care – PPO | Admitting: Vascular Surgery

## 2020-05-29 ENCOUNTER — Ambulatory Visit
Admission: RE | Admit: 2020-05-29 | Discharge: 2020-05-29 | Disposition: A | Discharge status: Home / Self Care | Payer: BC Managed Care – PPO | Source: Ambulatory Visit | Attending: Surgery | Admitting: Surgery

## 2020-05-29 ENCOUNTER — Encounter (HOSPITAL_COMMUNITY): Payer: Self-pay | Admitting: Surgery

## 2020-05-29 ENCOUNTER — Ambulatory Visit (HOSPITAL_COMMUNITY)
Admission: RE | Admit: 2020-05-29 | Discharge: 2020-05-29 | Disposition: A | Discharge status: Home / Self Care | Payer: BC Managed Care – PPO | Attending: Surgery | Admitting: Surgery

## 2020-05-29 ENCOUNTER — Other Ambulatory Visit: Payer: Self-pay

## 2020-05-29 ENCOUNTER — Encounter (HOSPITAL_COMMUNITY)
Admission: RE | Disposition: A | Discharge status: Home / Self Care | Payer: Self-pay | Source: Home / Self Care | Attending: Surgery

## 2020-05-29 ENCOUNTER — Ambulatory Visit (HOSPITAL_COMMUNITY): Payer: BC Managed Care – PPO | Admitting: Anesthesiology

## 2020-05-29 ENCOUNTER — Encounter (HOSPITAL_COMMUNITY)
Admission: RE | Admit: 2020-05-29 | Discharge: 2020-05-29 | Disposition: A | Discharge status: Home / Self Care | Payer: BC Managed Care – PPO | Source: Ambulatory Visit | Attending: Surgery | Admitting: Surgery

## 2020-05-29 DIAGNOSIS — C50912 Malignant neoplasm of unspecified site of left female breast: Secondary | ICD-10-CM

## 2020-05-29 DIAGNOSIS — C50512 Malignant neoplasm of lower-outer quadrant of left female breast: Secondary | ICD-10-CM | POA: Diagnosis not present

## 2020-05-29 DIAGNOSIS — Z170 Estrogen receptor positive status [ER+]: Secondary | ICD-10-CM

## 2020-05-29 DIAGNOSIS — Z20822 Contact with and (suspected) exposure to covid-19: Secondary | ICD-10-CM | POA: Insufficient documentation

## 2020-05-29 HISTORY — PX: BREAST LUMPECTOMY WITH RADIOACTIVE SEED AND SENTINEL LYMPH NODE BIOPSY: SHX6550

## 2020-05-29 HISTORY — PX: BREAST LUMPECTOMY: SHX2

## 2020-05-29 SURGERY — BREAST LUMPECTOMY WITH RADIOACTIVE SEED AND SENTINEL LYMPH NODE BIOPSY
Anesthesia: Regional | Site: Breast | Laterality: Left

## 2020-05-29 MED ORDER — 0.9 % SODIUM CHLORIDE (POUR BTL) OPTIME
TOPICAL | Status: DC | PRN
  Administered 2020-05-29: 1000 mL

## 2020-05-29 MED ORDER — OXYCODONE HCL 5 MG/5ML PO SOLN
5.0000 mg | Freq: Once | ORAL | Status: DC | PRN
Start: 2020-05-29 — End: 2020-05-29

## 2020-05-29 MED ORDER — PROPOFOL 10 MG/ML IV BOLUS
INTRAVENOUS | Status: AC
  Filled 2020-05-29: qty 20

## 2020-05-29 MED ORDER — METHYLENE BLUE 0.5 % INJ SOLN
INTRAVENOUS | Status: AC
  Filled 2020-05-29: qty 10

## 2020-05-29 MED ORDER — FENTANYL CITRATE (PF) 250 MCG/5ML IJ SOLN
INTRAMUSCULAR | Status: AC
  Filled 2020-05-29: qty 5

## 2020-05-29 MED ORDER — ORAL CARE MOUTH RINSE: 15.0000 mL | Freq: Once | OROMUCOSAL | Status: AC

## 2020-05-29 MED ORDER — DEXAMETHASONE SODIUM PHOSPHATE 10 MG/ML IJ SOLN
INTRAMUSCULAR | Status: DC | PRN
  Administered 2020-05-29: 5 mg

## 2020-05-29 MED ORDER — MIDAZOLAM HCL 2 MG/2ML IJ SOLN
INTRAMUSCULAR | Status: AC
  Filled 2020-05-29: qty 2

## 2020-05-29 MED ORDER — SODIUM CHLORIDE (PF) 0.9 % IJ SOLN
INTRAMUSCULAR | Status: AC
  Filled 2020-05-29: qty 10

## 2020-05-29 MED ORDER — CHLORHEXIDINE GLUCONATE 0.12 % MT SOLN: 15.0000 mL | Freq: Once | OROMUCOSAL | Status: AC

## 2020-05-29 MED ORDER — ONDANSETRON HCL 4 MG/2ML IJ SOLN
INTRAMUSCULAR | Status: DC | PRN
  Administered 2020-05-29: 4 mg via INTRAVENOUS

## 2020-05-29 MED ORDER — BUPIVACAINE HCL (PF) 0.25 % IJ SOLN
INTRAMUSCULAR | Status: AC
  Filled 2020-05-29: qty 30

## 2020-05-29 MED ORDER — FENTANYL CITRATE (PF) 250 MCG/5ML IJ SOLN
INTRAMUSCULAR | Status: DC | PRN
  Administered 2020-05-29: 50 ug via INTRAVENOUS
  Administered 2020-05-29: 25 ug via INTRAVENOUS

## 2020-05-29 MED ORDER — FENTANYL CITRATE (PF) 100 MCG/2ML IJ SOLN
INTRAMUSCULAR | Status: AC
  Filled 2020-05-29: qty 2

## 2020-05-29 MED ORDER — CEFAZOLIN SODIUM-DEXTROSE 2-4 GM/100ML-% IV SOLN
2.0000 g | INTRAVENOUS | Status: AC
  Administered 2020-05-29: 2 g via INTRAVENOUS
  Filled 2020-05-29: qty 100

## 2020-05-29 MED ORDER — LIDOCAINE 2% (20 MG/ML) 5 ML SYRINGE
INTRAMUSCULAR | Status: AC
  Filled 2020-05-29: qty 5

## 2020-05-29 MED ORDER — PROPOFOL 10 MG/ML IV BOLUS
INTRAVENOUS | Status: DC | PRN
  Administered 2020-05-29: 130 mg via INTRAVENOUS

## 2020-05-29 MED ORDER — BUPIVACAINE HCL (PF) 0.25 % IJ SOLN
INTRAMUSCULAR | Status: DC | PRN
  Administered 2020-05-29: 10 mL

## 2020-05-29 MED ORDER — FENTANYL CITRATE (PF) 100 MCG/2ML IJ SOLN
25.0000 ug | INTRAMUSCULAR | Status: DC | PRN
Start: 2020-05-29 — End: 2020-05-29

## 2020-05-29 MED ORDER — OXYCODONE HCL 5 MG PO TABS: 5.0000 mg | ORAL_TABLET | Freq: Once | ORAL | Status: DC | PRN

## 2020-05-29 MED ORDER — LIDOCAINE 2% (20 MG/ML) 5 ML SYRINGE
INTRAMUSCULAR | Status: DC | PRN
  Administered 2020-05-29: 60 mg via INTRAVENOUS

## 2020-05-29 MED ORDER — AMISULPRIDE (ANTIEMETIC) 5 MG/2ML IV SOLN: 10.0000 mg | Freq: Once | INTRAVENOUS | Status: DC | PRN

## 2020-05-29 MED ORDER — LACTATED RINGERS IV SOLN: INTRAVENOUS | Status: DC

## 2020-05-29 MED ORDER — CHLORHEXIDINE GLUCONATE CLOTH 2 % EX PADS: 6.0000 | MEDICATED_PAD | Freq: Once | CUTANEOUS | Status: DC

## 2020-05-29 MED ORDER — HEMOSTATIC AGENTS (NO CHARGE) OPTIME
TOPICAL | Status: DC | PRN
  Administered 2020-05-29: 1 via TOPICAL

## 2020-05-29 MED ORDER — APREPITANT 40 MG PO CAPS
40.0000 mg | ORAL_CAPSULE | Freq: Once | ORAL | Status: AC
  Administered 2020-05-29: 40 mg via ORAL
  Filled 2020-05-29: qty 1

## 2020-05-29 MED ORDER — DEXAMETHASONE SODIUM PHOSPHATE 10 MG/ML IJ SOLN
INTRAMUSCULAR | Status: AC
  Filled 2020-05-29: qty 1

## 2020-05-29 MED ORDER — PHENYLEPHRINE 40 MCG/ML (10ML) SYRINGE FOR IV PUSH (FOR BLOOD PRESSURE SUPPORT)
PREFILLED_SYRINGE | INTRAVENOUS | Status: DC | PRN
  Administered 2020-05-29 (×3): 40 ug via INTRAVENOUS

## 2020-05-29 MED ORDER — GLYCOPYRROLATE PF 0.2 MG/ML IJ SOSY
PREFILLED_SYRINGE | INTRAMUSCULAR | Status: DC | PRN
  Administered 2020-05-29 (×2): .1 mg via INTRAVENOUS

## 2020-05-29 MED ORDER — TECHNETIUM TC 99M TILMANOCEPT KIT
1.0000 | PACK | Freq: Once | INTRAVENOUS | Status: AC | PRN
  Administered 2020-05-29: 1 via INTRADERMAL

## 2020-05-29 MED ORDER — PROMETHAZINE HCL 25 MG/ML IJ SOLN: 6.2500 mg | INTRAMUSCULAR | Status: DC | PRN

## 2020-05-29 MED ORDER — SCOPOLAMINE 1 MG/3DAYS TD PT72
1.0000 | MEDICATED_PATCH | TRANSDERMAL | Status: DC
  Administered 2020-05-29: 1.5 mg via TRANSDERMAL
  Filled 2020-05-29: qty 1

## 2020-05-29 MED ORDER — GLYCOPYRROLATE PF 0.2 MG/ML IJ SOSY
PREFILLED_SYRINGE | INTRAMUSCULAR | Status: AC
  Filled 2020-05-29: qty 1

## 2020-05-29 MED ORDER — ROPIVACAINE HCL 5 MG/ML IJ SOLN
INTRAMUSCULAR | Status: DC | PRN
  Administered 2020-05-29: 30 mL via PERINEURAL

## 2020-05-29 MED ORDER — MIDAZOLAM HCL 5 MG/5ML IJ SOLN
INTRAMUSCULAR | Status: DC | PRN
  Administered 2020-05-29: 2 mg via INTRAVENOUS

## 2020-05-29 MED ORDER — PHENYLEPHRINE 40 MCG/ML (10ML) SYRINGE FOR IV PUSH (FOR BLOOD PRESSURE SUPPORT)
PREFILLED_SYRINGE | INTRAVENOUS | Status: AC
  Filled 2020-05-29: qty 10

## 2020-05-29 MED ORDER — IBUPROFEN 800 MG PO TABS: 800.0000 mg | ORAL_TABLET | Freq: Three times a day (TID) | ORAL | 0 refills | Status: DC | PRN

## 2020-05-29 MED ORDER — DEXAMETHASONE SODIUM PHOSPHATE 10 MG/ML IJ SOLN
INTRAMUSCULAR | Status: DC | PRN
  Administered 2020-05-29: 8 mg via INTRAVENOUS

## 2020-05-29 MED ORDER — ACETAMINOPHEN 500 MG PO TABS
1000.0000 mg | ORAL_TABLET | ORAL | Status: AC
  Administered 2020-05-29: 1000 mg via ORAL
  Filled 2020-05-29: qty 2

## 2020-05-29 MED ORDER — SODIUM CHLORIDE 0.9 % IV SOLN: INTRAVENOUS | Status: DC | PRN

## 2020-05-29 MED ORDER — ONDANSETRON HCL 4 MG/2ML IJ SOLN
INTRAMUSCULAR | Status: AC
  Filled 2020-05-29: qty 2

## 2020-05-29 MED ORDER — CHLORHEXIDINE GLUCONATE 0.12 % MT SOLN
OROMUCOSAL | Status: AC
  Administered 2020-05-29: 15 mL via OROMUCOSAL
  Filled 2020-05-29: qty 15

## 2020-05-29 MED ORDER — HYDROCODONE-ACETAMINOPHEN 5-325 MG PO TABS: 1.0000 | ORAL_TABLET | Freq: Four times a day (QID) | ORAL | 0 refills | Status: DC | PRN

## 2020-05-29 SURGICAL SUPPLY — 42 items
APPLIER CLIP 9.375 MED OPEN (MISCELLANEOUS) ×2
BINDER BREAST LRG (GAUZE/BANDAGES/DRESSINGS) ×2 IMPLANT
BINDER BREAST XLRG (GAUZE/BANDAGES/DRESSINGS) IMPLANT
CANISTER SUCT 3000ML PPV (MISCELLANEOUS) ×2 IMPLANT
CHLORAPREP W/TINT 26 (MISCELLANEOUS) ×2 IMPLANT
CLIP APPLIE 9.375 MED OPEN (MISCELLANEOUS) ×1 IMPLANT
CNTNR URN SCR LID CUP LEK RST (MISCELLANEOUS) ×1 IMPLANT
CONT SPEC 4OZ STRL OR WHT (MISCELLANEOUS) ×2
COVER PROBE W GEL 5X96 (DRAPES) ×2 IMPLANT
COVER SURGICAL LIGHT HANDLE (MISCELLANEOUS) ×2 IMPLANT
COVER WAND RF STERILE (DRAPES) IMPLANT
DERMABOND ADVANCED (GAUZE/BANDAGES/DRESSINGS) ×1
DERMABOND ADVANCED .7 DNX12 (GAUZE/BANDAGES/DRESSINGS) ×1 IMPLANT
DEVICE DUBIN SPECIMEN MAMMOGRA (MISCELLANEOUS) ×2 IMPLANT
DRAPE CHEST BREAST 15X10 FENES (DRAPES) ×2 IMPLANT
DRSG PAD ABDOMINAL 8X10 ST (GAUZE/BANDAGES/DRESSINGS) ×2 IMPLANT
ELECT CAUTERY BLADE 6.4 (BLADE) ×2 IMPLANT
ELECT REM PT RETURN 9FT ADLT (ELECTROSURGICAL) ×2
ELECTRODE REM PT RTRN 9FT ADLT (ELECTROSURGICAL) ×1 IMPLANT
GAUZE SPONGE 4X4 12PLY STRL (GAUZE/BANDAGES/DRESSINGS) ×2 IMPLANT
GLOVE BIO SURGEON STRL SZ8 (GLOVE) ×2 IMPLANT
GLOVE SRG 8 PF TXTR STRL LF DI (GLOVE) ×1 IMPLANT
GLOVE SURG UNDER POLY LF SZ8 (GLOVE) ×2
GOWN STRL REUS W/ TWL LRG LVL3 (GOWN DISPOSABLE) ×1 IMPLANT
GOWN STRL REUS W/ TWL XL LVL3 (GOWN DISPOSABLE) ×1 IMPLANT
GOWN STRL REUS W/TWL LRG LVL3 (GOWN DISPOSABLE) ×2
GOWN STRL REUS W/TWL XL LVL3 (GOWN DISPOSABLE) ×2
HEMOSTAT ARISTA ABSORB 3G PWDR (HEMOSTASIS) ×2 IMPLANT
KIT BASIN OR (CUSTOM PROCEDURE TRAY) ×2 IMPLANT
KIT MARKER MARGIN INK (KITS) ×2 IMPLANT
LIGHT WAVEGUIDE WIDE FLAT (MISCELLANEOUS) IMPLANT
NEEDLE 18GX1X1/2 (RX/OR ONLY) (NEEDLE) IMPLANT
NEEDLE FILTER BLUNT 18X 1/2SAF (NEEDLE)
NEEDLE FILTER BLUNT 18X1 1/2 (NEEDLE) IMPLANT
NEEDLE HYPO 25GX1X1/2 BEV (NEEDLE) ×2 IMPLANT
NS IRRIG 1000ML POUR BTL (IV SOLUTION) ×2 IMPLANT
PACK GENERAL/GYN (CUSTOM PROCEDURE TRAY) ×2 IMPLANT
SUT MNCRL AB 4-0 PS2 18 (SUTURE) ×4 IMPLANT
SUT VIC AB 3-0 SH 18 (SUTURE) ×4 IMPLANT
SYR CONTROL 10ML LL (SYRINGE) ×2 IMPLANT
TOWEL GREEN STERILE (TOWEL DISPOSABLE) ×2 IMPLANT
TOWEL GREEN STERILE FF (TOWEL DISPOSABLE) ×2 IMPLANT

## 2020-05-29 NOTE — Op Note (Signed)
Preoperative diagnosis: Stage I left breast cancer lower -outer quadrant   Postoperative diagnosis: Same   Procedure: Left breast seed localized lumpectomy with left axillary sentinel lymph node mapping   Surgeon: [name] M.D.   Assistant: Dr Lonia Skinner MD   Anesthesia: LMA with pectoral block anesthesia   EBL: 20 cc   Specimen: Left breast mass with clipped and seed to pathology and one axillary sentinel node hot and blue   Drains: None   Indications for procedure: Patient presents for treatment of her left breast cancer. She has opted for breast conservation after lengthy discussion of treatment options to include breast conservation surgery and mastectomy and reconstruction. Risks, benefits and alternatives discussed with the patient.The procedure has been discussed with the patient. Alternatives to surgery have been discussed with the patient. Risks of surgery include bleeding, Infection, Seroma formation, death, and the need for further surgery. The patient understands and wishes to proceed.Sentinel lymph node mapping and dissection has been discussed with the patient. Risk of bleeding, Infection, Seroma formation, Additional procedures,, Shoulder weakness , Shoulder stiffness, Nerve and blood vessel injury and reaction to the mapping dyes have been discussed. Alternatives to surgery have been discussed with the patient. The patient agrees to proceed.  Description of procedure: Patient underwent placement of left breast need a radiology earlier in the week. She presents to the holding area and questions are answered. Neoprobe was used to verify clip placement in the left breast. Patient underwent technetium sulfur colloid injection per protocol. Questions answered. Patient taken back to operating room and placed supine on the operating room table. Patient received 2 g of Ancef. After induction of LMA anesthesia left breast was prepped and draped in a sterile fashion  Of note,  patient had pectoral block by anesthesia prior to this. Neoprobe was used to identify the radioactive seen in the left lower -outer quadrant. Curvilinear incision made and dissection was carried around to excise all tissue around both the clip and seen. Radiograph showed the mass with gross negative margins. Both seed  and clip were in the specimen. Specimen sent to pathology.  Neoprobe was switched to the technetium sulfur colloid setting. Hot spot identify the left axilla. Incision made in the inferior axillary hairline and dissection carried into the axilla.  3 Hot  lymph node identified in level 1 basin  and excised. Background counts approached 0.  Arista applied to axillary incision. Wound  was irrigated and closed with 3-0 Vicryl and 4-0 Monocryl. Lumpectomy site closed in a similar fashion. Dermabond applied. All final counts sponge, needle instruments found to be correct at this point. Patient awoke, taken to recovery in satisfactory condition.

## 2020-05-29 NOTE — Transfer of Care (Signed)
Immediate Anesthesia Transfer of Care Note  Patient: [name]  Procedure(s) Performed: LEFT BREAST LUMPECTOMY WITH RADIOACTIVE SEED AND LEFT SENTINEL LYMPH NODE BIOPSY (Left Breast)  Patient Location: PACU  Anesthesia Type:General and Regional  Level of Consciousness: patient cooperative and responds to stimulation  Airway & Oxygen Therapy: Patient Spontanous Breathing and Patient connected to nasal cannula oxygen  Post-op Assessment: Report given to RN and Post -op Vital signs reviewed and stable  Post vital signs: Reviewed and stable  Last Vitals:  Vitals Value Taken Time  BP 127/72 05/29/20 1220  Temp    Pulse 72 05/29/20 1222  Resp 13 05/29/20 1222  SpO2 100 % 05/29/20 1222  Vitals shown include unvalidated device data.  Last Pain:  Vitals:   05/29/20 1218  TempSrc:   PainSc: 0-No pain         Complications: No complications documented.

## 2020-05-29 NOTE — Anesthesia Procedure Notes (Signed)
Anesthesia Regional Block: Pectoralis block   Pre-Anesthetic Checklist: ,, timeout performed, Correct Patient, Correct Site, Correct Laterality, Correct Procedure, Correct Position, site marked, Risks and benefits discussed,  Surgical consent,  Pre-op evaluation,  At surgeon's request and post-op pain management  Laterality: Left  Prep: Dura Prep       Needles:  Injection technique: Single-shot  Needle Type: Echogenic Stimulator Needle     Needle Length: 5cm  Needle Gauge: 20     Additional Needles:   Procedures:,,,, ultrasound used (permanent image in chart),,,,  Narrative:  Start time: 05/29/2020 10:30 AM End time: 05/29/2020 10:33 AM Injection made incrementally with aspirations every 5 mL.  Performed by: Personally  Anesthesiologist: Darral Dash, DO  Additional Notes: Patient identified. Risks/Benefits/Options discussed with patient including but not limited to bleeding, infection, nerve damage, failed block, incomplete pain control. Patient expressed understanding and wished to proceed. All questions were answered. Sterile technique was used throughout the entire procedure. Please see nursing notes for vital signs. Aspirated in 5cc intervals with injection for negative confirmation. Patient was given instructions on fall risk and not to get out of bed. All questions and concerns addressed with instructions to call with any issues or inadequate analgesia.

## 2020-05-29 NOTE — Interval H&P Note (Signed)
History and Physical Interval Note:  05/29/2020 9:27 AM  [name]  has presented today for surgery, with the diagnosis of BREAST  CANCER.  The various methods of treatment have been discussed with the patient and family. After consideration of risks, benefits and other options for treatment, the patient has consented to  Procedure(s) with comments: LEFT BREAST LUMPECTOMY WITH RADIOACTIVE SEED AND LEFT SENTINEL LYMPH NODE BIOPSY (Left) - P as a surgical intervention.  The patient's history has been reviewed, patient examined, no change in status, stable for surgery.  I have reviewed the patient's chart and labs.  Questions were answered to the patient's satisfaction.  Sentinel lymph node mapping and dissection has been discussed with the patient.  Risk of bleeding,  Infection,  Seroma formation,  Additional procedures,,  Shoulder weakness , ARM SWELLING,  Shoulder stiffness,  Nerve and blood vessel injury and reaction to the mapping dyes have been discussed.  Alternatives to surgery have been discussed with the patient.  The patient agrees to proceed. The procedure has been discussed with the patient. Alternatives to surgery have been discussed with the patient.  Risks of surgery include bleeding,  Infection,  Seroma formation, death,  and the need for further surgery.   The patient understands and wishes to proceed.    Turner Daniels MD

## 2020-05-29 NOTE — Discharge Instructions (Addendum)
[facility] °Office Phone Number [phone] ° °BREAST BIOPSY/ PARTIAL MASTECTOMY: POST OP INSTRUCTIONS ° °Always review your discharge instruction sheet given to you by the facility where your surgery was performed. ° °IF YOU HAVE DISABILITY OR FAMILY LEAVE FORMS, YOU MUST BRING THEM TO THE OFFICE FOR PROCESSING.  DO NOT GIVE THEM TO YOUR DOCTOR. ° °1. A prescription for pain medication may be given to you upon discharge.  Take your pain medication as prescribed, if needed.  If narcotic pain medicine is not needed, then you may take acetaminophen (Tylenol) or ibuprofen (Advil) as needed. °2. Take your usually prescribed medications unless otherwise directed °3. If you need a refill on your pain medication, please contact your pharmacy.  They will contact our office to request authorization.  Prescriptions will not be filled after 5pm or on week-ends. °4. You should eat very light the first 24 hours after surgery, such as soup, crackers, pudding, etc.  Resume your normal diet the day after surgery. °5. Most patients will experience some swelling and bruising in the breast.  Ice packs and a good support bra will help.  Swelling and bruising can take several days to resolve.  °6. It is common to experience some constipation if taking pain medication after surgery.  Increasing fluid intake and taking a stool softener will usually help or prevent this problem from occurring.  A mild laxative (Milk of Magnesia or Miralax) should be taken according to package directions if there are no bowel movements after 48 hours. °7. Unless discharge instructions indicate otherwise, you may remove your bandages 24-48 hours after surgery, and you may shower at that time.  You may have steri-strips (small skin tapes) in place directly over the incision.  These strips should be left on the skin for 7-10 days.  If your surgeon used skin glue on the incision, you may shower in 24 hours.  The glue will flake off over the  next 2-3 weeks.  Any sutures or staples will be removed at the office during your follow-up visit. °8. ACTIVITIES:  You may resume regular daily activities (gradually increasing) beginning the next day.  Wearing a good support bra or sports bra minimizes pain and swelling.  You may have sexual intercourse when it is comfortable. °a. You may drive when you no longer are taking prescription pain medication, you can comfortably wear a seatbelt, and you can safely maneuver your car and apply brakes. °b. RETURN TO WORK:  ______________________________________________________________________________________ °9. You should see your doctor in the office for a follow-up appointment approximately two weeks after your surgery.  Your doctor’s nurse will typically make your follow-up appointment when she calls you with your pathology report.  Expect your pathology report 2-3 business days after your surgery.  You may call to check if you do not hear from us after three days. °10. OTHER INSTRUCTIONS: _______________________________________________________________________________________________ _____________________________________________________________________________________________________________________________________ °_____________________________________________________________________________________________________________________________________ °_____________________________________________________________________________________________________________________________________ ° °WHEN TO CALL YOUR DOCTOR: °1. Fever over 101.0 °2. Nausea and/or vomiting. °3. Extreme swelling or bruising. °4. Continued bleeding from incision. °5. Increased pain, redness, or drainage from the incision. ° °The clinic staff is available to answer your questions during regular business hours.  Please don’t hesitate to call and ask to speak to one of the nurses for clinical concerns.  If you have a medical emergency, go to the nearest  emergency room or call 911.  A surgeon from [facility] is always on call at the hospital. ° °For further questions, please visit [email or web address]  °

## 2020-05-29 NOTE — Anesthesia Postprocedure Evaluation (Signed)
Anesthesia Post Note  Patient: [name]  Procedure(s) Performed: LEFT BREAST LUMPECTOMY WITH RADIOACTIVE SEED AND LEFT SENTINEL LYMPH NODE BIOPSY (Left Breast)     Patient location during evaluation: PACU Anesthesia Type: Regional and General Level of consciousness: awake and alert Pain management: pain level controlled Vital Signs Assessment: post-procedure vital signs reviewed and stable Respiratory status: spontaneous breathing, nonlabored ventilation, respiratory function stable and patient connected to nasal cannula oxygen Cardiovascular status: blood pressure returned to baseline and stable Postop Assessment: no apparent nausea or vomiting Anesthetic complications: no   No complications documented.  Last Vitals:  Vitals:   05/29/20 1248 05/29/20 1304  BP: 130/83 140/76  Pulse: 67 64  Resp: 12 14  Temp: 36.5 C 36.5 C  SpO2: 99% 96%    Last Pain:  Vitals:   05/29/20 1248  TempSrc:   PainSc: 0-No pain                 Belenda Cruise P Skylah Delauter

## 2020-05-30 ENCOUNTER — Encounter (HOSPITAL_COMMUNITY): Payer: Self-pay | Admitting: Surgery

## 2020-06-01 LAB — SURGICAL PATHOLOGY

## 2020-06-03 NOTE — Progress Notes (Signed)
Patient Care Team: Wayland Salinas, MD as PCP - General (Family Medicine) Mauro Kaufmann, RN as Oncology Nurse Navigator Rockwell Germany, RN as Oncology Nurse Navigator [name], MD as Consulting Physician (General Surgery) Nicholas Lose, MD as Consulting Physician (Hematology and Oncology) Eppie Gibson, MD as Attending Physician (Radiation Oncology)  DIAGNOSIS:    ICD-10-CM   1. Malignant neoplasm of lower-outer quadrant of left breast of female, estrogen receptor positive ([place])  C50.512    Z17.0     SUMMARY OF ONCOLOGIC HISTORY: Oncology History  Malignant neoplasm of lower-outer quadrant of left breast of female, estrogen receptor positive ([place])  05/04/2020 Initial Diagnosis   Screening mammogram showed a left breast asymmetry. Diagnostic mammogram and US showed a 0.3cm mass at the 6 o'clock position and no left axillary adenopathy. Biopsy showed invasive and in situ ductal carcinoma, grade 2, HER-2 equivocal by IHC, negative by FISH (ratio 1.27), ER+ 95%, PR+ 90%, Ki67 10%.   05/09/2020 Cancer Staging   Staging form: Breast, AJCC 8th Edition - Clinical stage from 05/09/2020: Stage IA (cT1a, cN0, cM0, G1, ER+, PR+, HER2-) - Signed by Nicholas Lose, MD on 05/09/2020 Stage prefix: Initial diagnosis   05/29/2020 Surgery   Left lumpectomy ([facility]): IDC with DCIS, 0.9cm, grade 2, clear margins, 3 left axillary lymph nodes negative for carcinoma.     CHIEF COMPLIANT: Follow-up s/p lumpectomy   INTERVAL HISTORY: [name] is a 60 y.o. with above-mentioned history of left breast cancer. She underwent a left lumpectomy on 05/29/20 with Dr. Brantley Stage for which pathology showed invasive ductal carcinoma, 0.9cm, grade 2, with DCIS, clear margins, 3 left axillary lymph nodes negative for carcinoma. She presents to the clinic today to review the pathology report and discuss further treatment.   ALLERGIES:  has No Known Allergies.  MEDICATIONS:  Current Outpatient Medications   Medication Sig Dispense Refill  . Ascorbic Acid (VITAMIN C) 1000 MG tablet Take 1,000 mg by mouth daily.    . Cholecalciferol (VITAMIN D3) 50 MCG (2000 UT) TABS Take 4,000 Units by mouth in the morning.    Marland Kitchen HYDROcodone-acetaminophen (NORCO/VICODIN) 5-325 MG tablet Take 1 tablet by mouth every 6 (six) hours as needed for moderate pain. 15 tablet 0  . ibuprofen (ADVIL) 800 MG tablet Take 1 tablet (800 mg total) by mouth every 8 (eight) hours as needed. 30 tablet 0  . LORazepam (ATIVAN) 0.5 MG tablet Take 1 tablet (0.5 mg total) by mouth every 8 (eight) hours. 20 tablet 0  . tretinoin (RETIN-A) 0.05 % cream Apply 1 application topically at bedtime.    Marland Kitchen XIIDRA 5 % SOLN Place 1 drop into both eyes in the morning.    . Zinc 50 MG TABS Take 50 mg by mouth in the morning.     No current facility-administered medications for this visit.    PHYSICAL EXAMINATION: ECOG PERFORMANCE STATUS: 1 - Symptomatic but completely ambulatory  Vitals:   06/04/20 1146  BP: (!) 152/77  Pulse: 66  Resp: 18  Temp: 98.4 F (36.9 C)  SpO2: 100%   Filed Weights   06/04/20 1146  Weight: 130 lb 9.6 oz (59.2 kg)     LABORATORY DATA:  I have reviewed the data as listed CMP Latest Ref Rng & Units 05/23/2020 05/09/2020 03/13/2016  Glucose 70 - 99 mg/dL 85 95 69  BUN 6 - 20 mg/dL $Remove'17 18 20  'SOdXdTR$ Creatinine 0.44 - 1.00 mg/dL 0.72 0.82 0.90  Sodium 135 - 145 mmol/L 139 142  140  Potassium 3.5 - 5.1 mmol/L 3.9 3.9 4.3  Chloride 98 - 111 mmol/L 105 106 102  CO2 22 - 32 mmol/L $RemoveB'26 27 25  'AzTmdyJU$ Calcium 8.9 - 10.3 mg/dL 9.5 9.5 9.0  Total Protein 6.5 - 8.1 g/dL 6.7 7.1 6.6  Total Bilirubin 0.3 - 1.2 mg/dL 1.8(H) 1.5(H) 1.6(H)  Alkaline Phos 38 - 126 U/L 62 74 61  AST 15 - 41 U/L $Remo'22 18 19  'bDaYw$ ALT 0 - 44 U/L $Remo'16 15 13    'VFtQw$ Lab Results  Component Value Date   WBC 6.1 05/23/2020   HGB 14.6 05/23/2020   HCT 44.2 05/23/2020   MCV 91.1 05/23/2020   PLT 271 05/23/2020   NEUTROABS 4.2 05/23/2020    ASSESSMENT & PLAN:  Malignant  neoplasm of lower-outer quadrant of left breast of female, estrogen receptor positive ([place]) 05/04/2020:Screening mammogram showed a left breast asymmetry. Diagnostic mammogram and US showed a 0.3cm mass at the 6 o'clock position and no left axillary adenopathy. 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  Pathology counseling: I discussed the final pathology report of the patient provided  a copy of this report. I discussed the margins as well as lymph node surgeries. We also discussed the final staging along with previously performed ER/PR and HER-2/neu testing.  Treatment Plan: 1. Oncotype Dx 2. Adj XRT (reluctant to consider XRT) 3. Adj Anti estrogen therapy with Anastrozole  RTC based on Oncotype     No orders of the defined types were placed in this encounter.  The patient has a good understanding of the overall plan. she agrees with it. she will call with any problems that may develop before the next visit here.  Total time spent: 30 mins including face to face time and time spent for planning, charting and coordination of care  Rulon Eisenmenger, MD, MPH 06/04/2020  I, Cloyde Reams [name], am acting as scribe for Dr. Nicholas Lose.  I have reviewed the above documentation for accuracy and completeness, and I agree with the above.

## 2020-06-03 NOTE — Assessment & Plan Note (Signed)
05/04/2020:Screening mammogram showed a left breast asymmetry. Diagnostic mammogram and US showed a 0.3cm mass at the 6 o'clock position and no left axillary adenopathy. 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  Pathology counseling: I discussed the final pathology report of the patient provided  a copy of this report. I discussed the margins as well as lymph node surgeries. We also discussed the final staging along with previously performed ER/PR and HER-2/neu testing.  Treatment Plan: 1. Oncotype Dx 2. Adj XRT (reluctant to consider XRT) 3. Adj Anti estrogen therapy with Anastrozole  RTC based on Oncotype

## 2020-06-04 ENCOUNTER — Telehealth: Payer: Self-pay | Admitting: *Deleted

## 2020-06-04 ENCOUNTER — Encounter: Payer: Self-pay | Admitting: *Deleted

## 2020-06-04 ENCOUNTER — Inpatient Hospital Stay: Payer: BC Managed Care – PPO | Attending: Hematology and Oncology | Admitting: Hematology and Oncology

## 2020-06-04 ENCOUNTER — Other Ambulatory Visit: Payer: Self-pay

## 2020-06-04 DIAGNOSIS — Z170 Estrogen receptor positive status [ER+]: Secondary | ICD-10-CM | POA: Insufficient documentation

## 2020-06-04 DIAGNOSIS — C50512 Malignant neoplasm of lower-outer quadrant of left female breast: Secondary | ICD-10-CM | POA: Diagnosis not present

## 2020-06-04 NOTE — Telephone Encounter (Signed)
Received order for oncotype testing. Requisition faxed to pathology and [facility] °

## 2020-06-14 ENCOUNTER — Telehealth: Payer: Self-pay | Admitting: *Deleted

## 2020-06-14 ENCOUNTER — Encounter: Payer: Self-pay | Admitting: *Deleted

## 2020-06-14 NOTE — Telephone Encounter (Signed)
Received oncotype of 6. Physician team notified. Called pt with results and informed chemo not recommended and next step is xrt with Dr. Isidore Moos.  Confirmed appt. Denies further needs or questions. Encourage pt to call with concerns. Received verbal understanding.

## 2020-06-19 ENCOUNTER — Encounter (HOSPITAL_COMMUNITY): Payer: Self-pay

## 2020-06-21 ENCOUNTER — Ambulatory Visit: Payer: BC Managed Care – PPO | Admitting: Physical Therapy

## 2020-06-25 ENCOUNTER — Other Ambulatory Visit: Payer: Self-pay

## 2020-06-25 ENCOUNTER — Ambulatory Visit: Payer: BC Managed Care – PPO | Attending: Surgery | Admitting: Physical Therapy

## 2020-06-25 ENCOUNTER — Encounter: Payer: Self-pay | Admitting: Physical Therapy

## 2020-06-25 DIAGNOSIS — Z483 Aftercare following surgery for neoplasm: Secondary | ICD-10-CM | POA: Insufficient documentation

## 2020-06-25 DIAGNOSIS — R293 Abnormal posture: Secondary | ICD-10-CM | POA: Insufficient documentation

## 2020-06-25 DIAGNOSIS — C50512 Malignant neoplasm of lower-outer quadrant of left female breast: Secondary | ICD-10-CM

## 2020-06-25 DIAGNOSIS — Z170 Estrogen receptor positive status [ER+]: Secondary | ICD-10-CM | POA: Insufficient documentation

## 2020-06-25 NOTE — Therapy (Signed)
[place], [place], [address] Phone: [phone]   Fax:  [phone]  Physical Therapy Treatment  Patient Details  Name: [name] MRN: [id number] Date of Birth: [birth year] Referring Provider (PT): Dr. [name]   Encounter Date: 06/25/2020   PT End of Session - 06/25/20 1447    Visit Number 2    Number of Visits 2    PT Start Time 1400    PT Stop Time 1444    PT Time Calculation (min) 44 min    Activity Tolerance Patient tolerated treatment well    Behavior During Therapy [facility] for tasks assessed/performed           Past Medical History:  Diagnosis Date  . Abnormal Pap smear    ASCUS/LGSIL  . Breast cancer ([place]) 05/2020   left breast  . 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 Gilbert's syndrome    pt denies problems  . History of kidney stones   . Infertility    Seen by RE @ [facility]  . PONV (postoperative nausea and vomiting)   . Vaginitis, atrophic     Past Surgical History:  Procedure Laterality Date  . BREAST LUMPECTOMY WITH RADIOACTIVE SEED AND SENTINEL LYMPH NODE BIOPSY Left 05/29/2020   Procedure: LEFT BREAST LUMPECTOMY WITH RADIOACTIVE SEED AND LEFT SENTINEL LYMPH NODE BIOPSY;  Surgeon: [name], MD;  Location: [facility];  Service: General;  Laterality: Left;  P  . CESAREAN SECTION  12/17/1993   [facility]  . DIAGNOSTIC LAPAROSCOPY    . HYSTEROSCOPY WITH D & C N/A 03/09/2014   Procedure: DILATATION AND CURETTAGE /HYSTEROSCOPY;  Surgeon: [name], MD;  Location: [facility];  Service: Gynecology;  Laterality: N/A;  . kidney stone removal  2010  . monalisa      There were no vitals filed for this visit.   Subjective Assessment - 06/25/20 1356    Subjective Patient reports she underwent a left lumpectomy and sentinel node biopsy (3 negative nodes) on 05/29/2020. Her Oncotype score was 6 so no chemotherapy is needed. She plans to undergo radiation and  anti-estrogen therapy.    Pertinent History Patient was diagnosed on 04/19/2020 with left grade II invasive ductal carcinoma breast cancer. She had left lumpectomy and sentinel node biopsy (3 negative nodes) on 05/29/2020. It is ER/PR positive and HER2 negative with a Ki67 of 10%.    Patient Stated Goals See how my arm is doing    Currently in Pain? No/denies              [facility] PT Assessment - 06/25/20 0001      Assessment   Medical Diagnosis s/p left lumpectomy and SLNB    Referring Provider (PT) Dr. Marcello Moores [facility]    Onset Date/Surgical Date 05/29/20    Hand Dominance Right    Prior Therapy Baselines      Precautions   Precautions Other (comment)    Precaution Comments recent surgery and left arm lymphedema risk      Restrictions   Weight Bearing Restrictions No      Balance Screen   Has the patient fallen in the past 6 months No    Has the patient had a decrease in activity level because of a fear of falling?  No    Is the patient reluctant to leave their home because of a fear of falling?  No      Home Environment  Living Environment Private residence    Living Arrangements Spouse/significant other    Available Help at Discharge Family      Prior Function   Level of [place] Unemployed    Leisure She walks and rides a stationary bike for 1 hour daily      Cognition   Overall Cognitive Status Within Functional Limits for tasks assessed      Observation/Other Assessments   Observations Left breast and axillary incisions both appear to be well healed with no redness or edema present.      Posture/Postural Control   Posture/Postural Control Postural limitations    Postural Limitations Rounded Shoulders;Forward head      ROM / Strength   AROM / PROM / Strength AROM      AROM   AROM Assessment Site Shoulder    Right/Left Shoulder Left    Left Shoulder Extension 49 Degrees    Left Shoulder Flexion 147 Degrees    Left Shoulder  ABduction 162 Degrees    Left Shoulder Internal Rotation 72 Degrees    Left Shoulder External Rotation 90 Degrees      Strength   Overall Strength Within functional limits for tasks performed             LYMPHEDEMA/ONCOLOGY QUESTIONNAIRE - 06/25/20 0001      Type   Cancer Type Left breast cancer      Surgeries   Lumpectomy Date 05/29/20    Sentinel Lymph Node Biopsy Date 05/29/20    Number Lymph Nodes Removed 3      Treatment   Active Chemotherapy Treatment No    Past Chemotherapy Treatment No    Active Radiation Treatment No    Past Radiation Treatment No    Current Hormone Treatment No    Past Hormone Therapy No      What other symptoms do you have   Are you Having Heaviness or Tightness No    Are you having Pain Yes    Are you having pitting edema No    Is it Hard or Difficult finding clothes that fit No    Do you have infections No    Is there Decreased scar mobility No    Stemmer Sign No      Lymphedema Assessments   Lymphedema Assessments Upper extremities      Right Upper Extremity Lymphedema   10 cm Proximal to Olecranon Process 25.8 cm    Olecranon Process 22.7 cm    10 cm Proximal to Ulnar Styloid Process 20.2 cm    Just Proximal to Ulnar Styloid Process 14.3 cm    Across Hand at [employer] 18.1 cm    At [place] of 2nd Digit 5.5 cm      Left Upper Extremity Lymphedema   10 cm Proximal to Olecranon Process 25.8 cm    Olecranon Process 22.7 cm    10 cm Proximal to Ulnar Styloid Process 19.1 cm    Just Proximal to Ulnar Styloid Process 14.2 cm    Across Hand at [employer] 17.5 cm    At [place] of 2nd Digit 5.6 cm              Quick Dash - 06/25/20 0001    Open a tight or new jar Mild difficulty    Do heavy household chores (wash walls, wash floors) No difficulty    Carry a shopping bag or briefcase No difficulty    Wash your back  No difficulty    Use a knife to cut food No difficulty    Recreational activities in which you take some  force or impact through your arm, shoulder, or hand (golf, hammering, tennis) No difficulty    During the past week, to what extent has your arm, shoulder or hand problem interfered with your normal social activities with family, friends, neighbors, or groups? Not at all    During the past week, to what extent has your arm, shoulder or hand problem limited your work or other regular daily activities Not at all    Arm, shoulder, or hand pain. None    Tingling (pins and needles) in your arm, shoulder, or hand None    Difficulty Sleeping No difficulty    DASH Score 2.27 %                          PT Education - 06/25/20 1447    Education Details Aftercare including scar massage; lymphedema risk reduction practices    Person(s) Educated Patient    Methods Explanation;Demonstration;Handout    Comprehension Returned demonstration;Verbalized understanding               PT Long Term Goals - 06/25/20 1451      PT LONG TERM GOAL #1   Title Patient will demonstrate she has regained full shoulder ROM and function post operatively compared to baselines.    Time 8    Period Weeks    Status Achieved                 Plan - 06/25/20 1448    Clinical Impression Statement Patient is doing very well s/p left lumpectomy and sentinel node biopsy on 05/29/2020 with 3 negative axillary nodes removed. She has regained full shoulder ROM and function and has no signs of lymphedema. Her incisions have healed very well. She plans to participate in the After Breast Cancer class on 07/16/2020 for education on lymphedema risk reduction, but otherwise has no PT needs at this time. She will come every 3 months for SOZO screens to detect subclinical lymphedema.    PT Treatment/Interventions ADLs/Self Care Home Management;Patient/family education    PT Next Visit Plan D/C    PT Home Exercise Plan Post op shoulder ROM HEP    Consulted and Agree with Plan of Care Patient           Patient  will benefit from skilled therapeutic intervention in order to improve the following deficits and impairments:  Postural dysfunction,Decreased range of motion,Decreased knowledge of precautions,Impaired UE functional use,Pain  Visit Diagnosis: Malignant neoplasm of lower-outer quadrant of left breast of female, estrogen receptor positive (HCC)  Abnormal posture  Aftercare following surgery for neoplasm     Problem List Patient Active Problem List   Diagnosis Date Noted  . Malignant neoplasm of lower-outer quadrant of left breast of female, estrogen receptor positive ([place]) 05/04/2020  . Postmenopausal bleeding 11/15/2013  . Hx of abnormal cervical Pap smear 09/17/2010  . Endometrial hyperplasia, simple 09/17/2010   PHYSICAL THERAPY DISCHARGE SUMMARY  Visits from Start of Care: 2  Current functional level related to goals / functional outcomes: Goals met. See above for objective findings.   Remaining deficits: None   Education / Equipment: Lymphedema risk reduction and post op HEP Plan: Patient agrees to discharge.  Patient goals were met.  meeting the stated rehab goals.  ?????         Annia Friendly, [place] 06/25/20 2:53 PM  [place], [place], [address] Phone: [phone]   Fax:  [phone]  Name: ANDREW SORIA MRN: [id number] Date of Birth: 1960/03/08

## 2020-06-25 NOTE — Patient Instructions (Signed)
            [facility]         [address] [place], [place] [address]         [phone]         Annia Friendly, PT, CLT   After Breast Cancer Class It is recommended you attend the ABC class to be educated on lymphedema risk reduction. This class is free of charge and lasts for 1 hour. It is a 1-time class.  You are scheduled for May 16th at 11:00. We will send you a link to join [employer].  Scar massage You can begin gentle scar massage using coconut oil or vitamin E cream (or whatever scar cream you have) a few minutes each day.   Compression garment You can get a compression sleeve to wear for flying. I'll give you a prescription.   Home exercise Program Continue doing your home exercises until you finish radiation to prevent any tightness.   Follow up PT: It is recommended you return every 3 months for the first 3 years following surgery to be assessed on the SOZO machine for an L-Dex score. This helps prevent clinically significant lymphedema in 95% of patients. These follow up screens are 15 minute appointments that you are not billed for. You are scheduled for July 11th at 4:40 but you can call us the week before to see if we can see you earlier in the day. It will be at our new clinic at [facility], [place].

## 2020-06-26 ENCOUNTER — Ambulatory Visit
Admission: RE | Admit: 2020-06-26 | Discharge: 2020-06-26 | Disposition: A | Discharge status: Home / Self Care | Payer: BC Managed Care – PPO | Source: Ambulatory Visit | Attending: Radiation Oncology | Admitting: Radiation Oncology

## 2020-06-26 ENCOUNTER — Encounter: Payer: Self-pay | Admitting: Radiation Oncology

## 2020-06-26 DIAGNOSIS — C50512 Malignant neoplasm of lower-outer quadrant of left female breast: Secondary | ICD-10-CM

## 2020-06-26 DIAGNOSIS — Z170 Estrogen receptor positive status [ER+]: Secondary | ICD-10-CM

## 2020-06-26 NOTE — Progress Notes (Signed)
Location of Breast Cancer: Malignant neoplasm of lower-outer quadrant of LEFT breast, estrogen receptor positive  Histology per Pathology Report:  05/29/2020 FINAL MICROSCOPIC DIAGNOSIS:  A. BREAST, LEFT, LUMPECTOMY:  - Invasive ductal carcinoma, 0.9 cm, Nottingham grade 2 of 3.  - Ductal carcinoma in situ, intermediate nuclear grade with  calcifications.  - Invasive carcinoma focally involves the posterior-inferior margin.  - In situ carcinoma is focally < 1 mm from the posterior-inferior  margin.  - See oncology table.  B. BREAST, LEFT, ANTERIOSUPERIOR MARGIN, EXCISION:  - Breast tissue, negative for carcinoma.  C. BREAST, LEFT, LATEROPOSTERIOR MARGIN, EXCISION:  - Breast tissue, negative for carcinoma.  D. BREAST, LEFT, INFERIOMEDIAL MARGIN, EXCISION:  - Breast tissue, negative for carcinoma.  E. SENTINEL LYMPH NODE, LEFT AXILLARY, BIOPSY:  - One lymph node, negative for carcinoma (0/1).  F. SENTINEL LYMPH NODE, LEFT AXILLARY, BIOPSY:  - One lymph node, negative for carcinoma (0/1).  G. SENTINEL LYMPH NODE, LEFT AXILLARY, BIOPSY:  - One lymph node, negative for carcinoma (0/1).   Receptor Status: ER(95%), PR (90%), Her2-neu (Negative via FISH), Ki-67(10%)  Did patient present with symptoms (if so, please note symptoms) or was this found on screening mammography?:  Screening mammogram showed a left breast asymmetry. Diagnostic mammogram and US showed a 0.3cm mass at the 6 o'clock position and no left axillary adenopathy.  Past/Anticipated interventions by surgeon, if any: 05/29/2020 Dr. Marcello Moores [facility] Left breast seed localized lumpectomy with left axillary sentinel lymph node mapping   Past/Anticipated interventions by medical oncology, if any:  Under care of Dr. Nicholas Lose 06/04/2020 --Treatment Plan: 1. Oncotype Dx 2. Adj XRT (reluctant to consider XRT) 3. Adj Anti estrogen therapy with Anastrozole --RTC based on Oncotype  06/14/2020: Per [employer]  "Received oncotype of 6. Physician team notified. Called pt with results and informed chemo not recommended and next step is xrt with Dr. Isidore Moos."  Lymphedema issues, if any:  None--patient saw PT yesterday 06/25/20 and no lymphedema was noted  Pain issues, if any:  Reports occasional tenderness near surgical site (mainly where her bra rubs against the incision)   SAFETY ISSUES:  Prior radiation? No  Pacemaker/ICD? No  Possible current pregnancy? No--postmenopausal  Is the patient on methotrexate? No  Current Complaints / other details:  Nothing else of note

## 2020-06-26 NOTE — Progress Notes (Signed)
Radiation Oncology         [phone] ________________________________  Name: [name] MRN: [id number]  Date: 06/26/2020  DOB: Mar 14, 1960  Follow-Up Visit Note by telephone.  The patient opted for telemedicine to maximize safety during the pandemic.  MyChart video was not obtainable.  Outpatient  CC: Ryter-Debois, Shyrl Numbers, MD  Nicholas Lose, MD  Diagnosis:      ICD-10-CM   1. Malignant neoplasm of lower-outer quadrant of left breast of female, estrogen receptor positive ([place])  C50.512    Z17.0      CHIEF COMPLAINT: Here to discuss management of left breast cancer  Narrative:  The patient returns today for follow-up. She was seen in consultation on 05/09/2020.   She has not undergone any significant imaging studies since consultation.  Breast/nodal surgery on the date of 05/29/2020 revealed: tumor size of 0.9 cm; histology of invasive ductal carcinoma with DCIS; margin status to invasive disease focally involved the posterior-inferior margin; margin status negative by at least 36mm; nodal status of negative (0/3); ER status: 95% strong; PR status: 90% strong; Her2 status: negative; Grade: 2.  Oncotype of 6. Chemo not recommended.  Lymphedema issues, if any:  None--patient saw PT yesterday 06/25/20 and no lymphedema was noted  Pain issues, if any:  Reports occasional tenderness near surgical site (mainly where her bra rubs against the incision)   SAFETY ISSUES:  Prior radiation? No  Pacemaker/ICD? No  Possible current pregnancy? No--postmenopausal  Is the patient on methotrexate? No  Current Complaints / other details:  Nothing else of note           ALLERGIES:  has No Known Allergies.  Meds: Current Outpatient Medications  Medication Sig Dispense Refill  . Ascorbic Acid (VITAMIN C) 1000 MG tablet Take 1,000 mg by mouth daily.    . Cholecalciferol (VITAMIN D3) 50 MCG (2000 UT) TABS Take 4,000 Units by mouth in the morning.    Marland Kitchen HYDROcodone-acetaminophen  (NORCO/VICODIN) 5-325 MG tablet Take 1 tablet by mouth every 6 (six) hours as needed for moderate pain. 15 tablet 0  . ibuprofen (ADVIL) 800 MG tablet Take 1 tablet (800 mg total) by mouth every 8 (eight) hours as needed. 30 tablet 0  . LORazepam (ATIVAN) 0.5 MG tablet Take 1 tablet (0.5 mg total) by mouth every 8 (eight) hours. 20 tablet 0  . tretinoin (RETIN-A) 0.05 % cream Apply 1 application topically at bedtime.    Marland Kitchen XIIDRA 5 % SOLN Place 1 drop into both eyes in the morning.    . Zinc 50 MG TABS Take 50 mg by mouth in the morning.     No current facility-administered medications for this encounter.    Physical Findings:  vitals were not taken for this visit. .     General: Alert and oriented, in no acute distress   Lab Findings: Lab Results  Component Value Date   WBC 6.1 05/23/2020   HGB 14.6 05/23/2020   HCT 44.2 05/23/2020   MCV 91.1 05/23/2020   PLT 271 05/23/2020      Radiographic Findings: NM Sentinel Node Inj-No Rpt (Breast)  Result Date: 05/29/2020 Sulfur colloid was injected by the nuclear medicine technologist for melanoma sentinel node.   MM Breast Surgical Specimen  Result Date: 05/29/2020 CLINICAL DATA:  Patient status post left breast lumpectomy. EXAM: SPECIMEN RADIOGRAPH OF THE LEFT BREAST COMPARISON:  Previous exam(s). FINDINGS: Status post excision of the left breast. The radioactive seed and biopsy marker clip are present, completely intact,  and were marked for pathology. IMPRESSION: Specimen radiograph of the left breast. Electronically Signed   By: Lovey Newcomer M.D.   On: 05/29/2020 11:30   MM LT RADIOACTIVE SEED LOC MAMMO GUIDE  Result Date: 05/29/2020 CLINICAL DATA:  Patient presents for radioactive seed localization a left breast carcinoma prior to surgical excision. EXAM: MAMMOGRAPHIC GUIDED RADIOACTIVE SEED LOCALIZATION OF THE LEFT BREAST COMPARISON:  Previous exam(s). FINDINGS: Patient presents for radioactive seed localization prior to surgical  excision. I met with the patient and we discussed the procedure of seed localization including benefits and alternatives. We discussed the high likelihood of a successful procedure. We discussed the risks of the procedure including infection, bleeding, tissue injury and further surgery. We discussed the low dose of radioactivity involved in the procedure. Informed, written consent was given. The usual time-out protocol was performed immediately prior to the procedure. Using mammographic guidance, sterile technique, 1% lidocaine and an I-125 radioactive seed, the ribbon shaped biopsy clip was localized using a lateral approach. The follow-up mammogram images confirm the seed in the expected location and were marked for Dr. Brantley Stage. Follow-up survey of the patient confirms presence of the radioactive seed. Order number of I-125 seed:  [id number]. Total activity:  2.683 millicuries reference Date: 05/25/2020. The patient tolerated the procedure well and was released from the [facility]. She was given instructions regarding seed removal. IMPRESSION: Radioactive seed localization of the left breast. No apparent complications. Electronically Signed   By: Lajean Manes M.D.   On: 05/29/2020 08:52    Impression/Plan: Left breast cancer  We discussed adjuvant radiotherapy today.  I recommend 4 weeks of radiation therapy to the left breast in order to reduce risk of local regional recurrence by two thirds.  I reviewed the logistics, benefits, risks, and potential side effects of this treatment in detail. Risks may include but not necessary be limited to acute and late injury tissue in the radiation fields such as skin irritation (change in color/pigmentation, itching, dryness, pain, peeling). She may experience fatigue. We also discussed possible risk of long term cosmetic changes or scar tissue. There is also a smaller risk for lung toxicity, cardiac toxicity, lymphedema, musculoskeletal changes, rib fragility or  induction of a second malignancy, late chronic non-healing soft tissue wound.  We discussed the techniques and technology available to minimize the risk of long-term side effects.  The patient asked good questions which I answered to her satisfaction. She is enthusiastic about proceeding with treatment.   She will undergo treatment planning tomorrow and I look forward to seeing her in person and to signing a consent form with her at that time.  Anticipate starting treatment next week.  This encounter was provided by telemedicine platform; patient desired telemedicine during pandemic precautions.  MyChart video was not available and therefore telephone was used. The patient has given verbal consent for this type of encounter and has been advised to only accept a meeting of this type in a secure network environment. On date of service, in total, I spent 30 minutes on this encounter.   The attendants for this meeting include Eppie Gibson  and Priscille Kluver During the encounter, Eppie Gibson was located at [facility] Radiation Oncology Department.  [name] was located at home.  _____________________________________   Eppie Gibson, MD  This document serves as a record of services personally performed by Eppie Gibson, MD. It was created on his behalf by Clerance Lav, a trained medical scribe. The creation of  this record is based on the scribe's personal observations and the provider's statements to them. This document has been checked and approved by the attending provider.

## 2020-06-27 ENCOUNTER — Other Ambulatory Visit: Payer: Self-pay

## 2020-06-27 ENCOUNTER — Ambulatory Visit
Admission: RE | Admit: 2020-06-27 | Discharge: 2020-06-27 | Disposition: A | Discharge status: Home / Self Care | Payer: BC Managed Care – PPO | Source: Ambulatory Visit | Attending: Radiation Oncology | Admitting: Radiation Oncology

## 2020-06-27 ENCOUNTER — Ambulatory Visit: Payer: BC Managed Care – PPO | Admitting: Radiation Oncology

## 2020-06-27 ENCOUNTER — Encounter: Payer: Self-pay | Admitting: Radiation Oncology

## 2020-06-27 DIAGNOSIS — C50512 Malignant neoplasm of lower-outer quadrant of left female breast: Secondary | ICD-10-CM | POA: Diagnosis present

## 2020-06-29 DIAGNOSIS — C50512 Malignant neoplasm of lower-outer quadrant of left female breast: Secondary | ICD-10-CM | POA: Diagnosis not present

## 2020-07-02 ENCOUNTER — Encounter: Payer: Self-pay | Admitting: *Deleted

## 2020-07-03 ENCOUNTER — Telehealth: Payer: Self-pay | Admitting: Hematology and Oncology

## 2020-07-03 NOTE — Telephone Encounter (Signed)
Scheduled appt per 5/2 sch msg. Pt aware.  

## 2020-07-04 ENCOUNTER — Ambulatory Visit
Admission: RE | Admit: 2020-07-04 | Discharge: 2020-07-04 | Disposition: A | Discharge status: Home / Self Care | Payer: BC Managed Care – PPO | Source: Ambulatory Visit | Attending: Radiation Oncology | Admitting: Radiation Oncology

## 2020-07-04 ENCOUNTER — Other Ambulatory Visit: Payer: Self-pay

## 2020-07-04 DIAGNOSIS — Z170 Estrogen receptor positive status [ER+]: Secondary | ICD-10-CM | POA: Diagnosis present

## 2020-07-04 DIAGNOSIS — C50512 Malignant neoplasm of lower-outer quadrant of left female breast: Secondary | ICD-10-CM | POA: Diagnosis present

## 2020-07-05 ENCOUNTER — Ambulatory Visit
Admission: RE | Admit: 2020-07-05 | Discharge: 2020-07-05 | Disposition: A | Discharge status: Home / Self Care | Payer: BC Managed Care – PPO | Source: Ambulatory Visit | Attending: Radiation Oncology | Admitting: Radiation Oncology

## 2020-07-05 DIAGNOSIS — C50512 Malignant neoplasm of lower-outer quadrant of left female breast: Secondary | ICD-10-CM | POA: Diagnosis not present

## 2020-07-06 ENCOUNTER — Ambulatory Visit
Admission: RE | Admit: 2020-07-06 | Discharge: 2020-07-06 | Disposition: A | Discharge status: Home / Self Care | Payer: BC Managed Care – PPO | Source: Ambulatory Visit | Attending: Radiation Oncology | Admitting: Radiation Oncology

## 2020-07-06 ENCOUNTER — Other Ambulatory Visit: Payer: Self-pay

## 2020-07-06 DIAGNOSIS — C50512 Malignant neoplasm of lower-outer quadrant of left female breast: Secondary | ICD-10-CM | POA: Diagnosis not present

## 2020-07-09 ENCOUNTER — Other Ambulatory Visit: Payer: Self-pay

## 2020-07-09 ENCOUNTER — Ambulatory Visit
Admission: RE | Admit: 2020-07-09 | Discharge: 2020-07-09 | Disposition: A | Discharge status: Home / Self Care | Payer: BC Managed Care – PPO | Source: Ambulatory Visit | Attending: Radiation Oncology | Admitting: Radiation Oncology

## 2020-07-09 DIAGNOSIS — C50512 Malignant neoplasm of lower-outer quadrant of left female breast: Secondary | ICD-10-CM

## 2020-07-09 DIAGNOSIS — Z170 Estrogen receptor positive status [ER+]: Secondary | ICD-10-CM

## 2020-07-09 MED ORDER — RADIAPLEXRX EX GEL: Freq: Once | CUTANEOUS | Status: AC

## 2020-07-09 MED ORDER — ALRA NON-METALLIC DEODORANT (RAD-ONC)
1.0000 "application " | Freq: Once | TOPICAL | Status: AC
  Administered 2020-07-09: 1 via TOPICAL

## 2020-07-09 NOTE — Progress Notes (Signed)

## 2020-07-10 ENCOUNTER — Ambulatory Visit
Admission: RE | Admit: 2020-07-10 | Discharge: 2020-07-10 | Disposition: A | Discharge status: Home / Self Care | Payer: BC Managed Care – PPO | Source: Ambulatory Visit | Attending: Radiation Oncology | Admitting: Radiation Oncology

## 2020-07-10 DIAGNOSIS — C50512 Malignant neoplasm of lower-outer quadrant of left female breast: Secondary | ICD-10-CM | POA: Diagnosis not present

## 2020-07-11 ENCOUNTER — Ambulatory Visit
Admission: RE | Admit: 2020-07-11 | Discharge: 2020-07-11 | Disposition: A | Discharge status: Home / Self Care | Payer: BC Managed Care – PPO | Source: Ambulatory Visit | Attending: Radiation Oncology | Admitting: Radiation Oncology

## 2020-07-11 ENCOUNTER — Other Ambulatory Visit: Payer: Self-pay

## 2020-07-11 DIAGNOSIS — C50512 Malignant neoplasm of lower-outer quadrant of left female breast: Secondary | ICD-10-CM | POA: Diagnosis not present

## 2020-07-12 ENCOUNTER — Ambulatory Visit
Admission: RE | Admit: 2020-07-12 | Discharge: 2020-07-12 | Disposition: A | Discharge status: Home / Self Care | Payer: BC Managed Care – PPO | Source: Ambulatory Visit | Attending: Radiation Oncology | Admitting: Radiation Oncology

## 2020-07-12 DIAGNOSIS — C50512 Malignant neoplasm of lower-outer quadrant of left female breast: Secondary | ICD-10-CM | POA: Diagnosis not present

## 2020-07-13 ENCOUNTER — Ambulatory Visit
Admission: RE | Admit: 2020-07-13 | Discharge: 2020-07-13 | Disposition: A | Discharge status: Home / Self Care | Payer: BC Managed Care – PPO | Source: Ambulatory Visit | Attending: Radiation Oncology | Admitting: Radiation Oncology

## 2020-07-13 DIAGNOSIS — C50512 Malignant neoplasm of lower-outer quadrant of left female breast: Secondary | ICD-10-CM | POA: Diagnosis not present

## 2020-07-16 ENCOUNTER — Other Ambulatory Visit: Payer: Self-pay

## 2020-07-16 ENCOUNTER — Ambulatory Visit: Payer: BC Managed Care – PPO | Admitting: Radiation Oncology

## 2020-07-16 ENCOUNTER — Ambulatory Visit
Admission: RE | Admit: 2020-07-16 | Discharge: 2020-07-16 | Disposition: A | Discharge status: Home / Self Care | Payer: BC Managed Care – PPO | Source: Ambulatory Visit | Attending: Radiation Oncology | Admitting: Radiation Oncology

## 2020-07-16 DIAGNOSIS — C50512 Malignant neoplasm of lower-outer quadrant of left female breast: Secondary | ICD-10-CM | POA: Diagnosis not present

## 2020-07-17 ENCOUNTER — Ambulatory Visit
Admission: RE | Admit: 2020-07-17 | Discharge: 2020-07-17 | Disposition: A | Discharge status: Home / Self Care | Payer: BC Managed Care – PPO | Source: Ambulatory Visit | Attending: Radiation Oncology | Admitting: Radiation Oncology

## 2020-07-17 DIAGNOSIS — C50512 Malignant neoplasm of lower-outer quadrant of left female breast: Secondary | ICD-10-CM | POA: Diagnosis not present

## 2020-07-18 ENCOUNTER — Ambulatory Visit
Admission: RE | Admit: 2020-07-18 | Discharge: 2020-07-18 | Disposition: A | Discharge status: Home / Self Care | Payer: BC Managed Care – PPO | Source: Ambulatory Visit | Attending: Radiation Oncology | Admitting: Radiation Oncology

## 2020-07-18 ENCOUNTER — Other Ambulatory Visit: Payer: Self-pay

## 2020-07-18 DIAGNOSIS — C50512 Malignant neoplasm of lower-outer quadrant of left female breast: Secondary | ICD-10-CM | POA: Diagnosis not present

## 2020-07-19 ENCOUNTER — Ambulatory Visit
Admission: RE | Admit: 2020-07-19 | Discharge: 2020-07-19 | Disposition: A | Discharge status: Home / Self Care | Payer: BC Managed Care – PPO | Source: Ambulatory Visit | Attending: Radiation Oncology | Admitting: Radiation Oncology

## 2020-07-19 ENCOUNTER — Other Ambulatory Visit: Payer: Self-pay

## 2020-07-19 DIAGNOSIS — C50512 Malignant neoplasm of lower-outer quadrant of left female breast: Secondary | ICD-10-CM | POA: Diagnosis not present

## 2020-07-20 ENCOUNTER — Ambulatory Visit
Admission: RE | Admit: 2020-07-20 | Discharge: 2020-07-20 | Disposition: A | Discharge status: Home / Self Care | Payer: BC Managed Care – PPO | Source: Ambulatory Visit | Attending: Radiation Oncology | Admitting: Radiation Oncology

## 2020-07-20 DIAGNOSIS — C50512 Malignant neoplasm of lower-outer quadrant of left female breast: Secondary | ICD-10-CM | POA: Diagnosis not present

## 2020-07-23 ENCOUNTER — Other Ambulatory Visit: Payer: Self-pay

## 2020-07-23 ENCOUNTER — Ambulatory Visit
Admission: RE | Admit: 2020-07-23 | Discharge: 2020-07-23 | Disposition: A | Discharge status: Home / Self Care | Payer: BC Managed Care – PPO | Source: Ambulatory Visit | Attending: Radiation Oncology | Admitting: Radiation Oncology

## 2020-07-23 DIAGNOSIS — C50512 Malignant neoplasm of lower-outer quadrant of left female breast: Secondary | ICD-10-CM | POA: Diagnosis not present

## 2020-07-24 ENCOUNTER — Ambulatory Visit
Admission: RE | Admit: 2020-07-24 | Discharge: 2020-07-24 | Disposition: A | Discharge status: Home / Self Care | Payer: BC Managed Care – PPO | Source: Ambulatory Visit | Attending: Radiation Oncology | Admitting: Radiation Oncology

## 2020-07-24 DIAGNOSIS — C50512 Malignant neoplasm of lower-outer quadrant of left female breast: Secondary | ICD-10-CM | POA: Diagnosis not present

## 2020-07-25 ENCOUNTER — Ambulatory Visit
Admission: RE | Admit: 2020-07-25 | Discharge: 2020-07-25 | Disposition: A | Discharge status: Home / Self Care | Payer: BC Managed Care – PPO | Source: Ambulatory Visit | Attending: Radiation Oncology | Admitting: Radiation Oncology

## 2020-07-25 DIAGNOSIS — C50512 Malignant neoplasm of lower-outer quadrant of left female breast: Secondary | ICD-10-CM | POA: Diagnosis not present

## 2020-07-26 ENCOUNTER — Ambulatory Visit
Admission: RE | Admit: 2020-07-26 | Discharge: 2020-07-26 | Disposition: A | Discharge status: Home / Self Care | Payer: BC Managed Care – PPO | Source: Ambulatory Visit | Attending: Radiation Oncology | Admitting: Radiation Oncology

## 2020-07-26 ENCOUNTER — Other Ambulatory Visit: Payer: Self-pay

## 2020-07-26 DIAGNOSIS — C50512 Malignant neoplasm of lower-outer quadrant of left female breast: Secondary | ICD-10-CM | POA: Diagnosis not present

## 2020-07-27 ENCOUNTER — Ambulatory Visit
Admission: RE | Admit: 2020-07-27 | Discharge: 2020-07-27 | Disposition: A | Discharge status: Home / Self Care | Payer: BC Managed Care – PPO | Source: Ambulatory Visit | Attending: Radiation Oncology | Admitting: Radiation Oncology

## 2020-07-27 DIAGNOSIS — C50512 Malignant neoplasm of lower-outer quadrant of left female breast: Secondary | ICD-10-CM | POA: Diagnosis not present

## 2020-07-31 ENCOUNTER — Encounter: Payer: Self-pay | Admitting: *Deleted

## 2020-07-31 ENCOUNTER — Other Ambulatory Visit: Payer: Self-pay

## 2020-07-31 ENCOUNTER — Ambulatory Visit
Admission: RE | Admit: 2020-07-31 | Discharge: 2020-07-31 | Disposition: A | Discharge status: Home / Self Care | Payer: BC Managed Care – PPO | Source: Ambulatory Visit | Attending: Radiation Oncology | Admitting: Radiation Oncology

## 2020-07-31 DIAGNOSIS — C50512 Malignant neoplasm of lower-outer quadrant of left female breast: Secondary | ICD-10-CM | POA: Diagnosis not present

## 2020-07-31 NOTE — Progress Notes (Signed)
Patient Care Team: Deloris Ping, MD as PCP - General (Family Medicine) Pershing Proud, RN as Oncology Nurse Navigator Donnelly Angelica, RN as Oncology Nurse Navigator Harriette Bouillon, MD as Consulting Physician (General Surgery) Serena Croissant, MD as Consulting Physician (Hematology and Oncology) Lonie Peak, MD as Attending Physician (Radiation Oncology)  DIAGNOSIS:    ICD-10-CM   1. Malignant neoplasm of lower-outer quadrant of left breast of female, estrogen receptor positive (HCC)  C50.512    Z17.0     SUMMARY OF ONCOLOGIC HISTORY: Oncology History  Malignant neoplasm of lower-outer quadrant of left breast of female, estrogen receptor positive (HCC)  05/04/2020 Initial Diagnosis   Screening mammogram showed a left breast asymmetry. Diagnostic mammogram and US showed a 0.3cm mass at the 6 o'clock position and no left axillary adenopathy. Biopsy showed invasive and in situ ductal carcinoma, grade 2, HER-2 equivocal by IHC, negative by FISH (ratio 1.27), ER+ 95%, PR+ 90%, Ki67 10%.   05/09/2020 Cancer Staging   Staging form: Breast, AJCC 8th Edition - Clinical stage from 05/09/2020: Stage IA (cT1a, cN0, cM0, G1, ER+, PR+, HER2-) - Signed by Serena Croissant, MD on 05/09/2020 Stage prefix: Initial diagnosis   05/29/2020 Surgery   Left lumpectomy ([facility]): IDC with DCIS, 0.9cm, grade 2, clear margins, 3 left axillary lymph nodes negative for carcinoma.   06/13/2020 Cancer Staging   Staging form: Breast, AJCC 8th Edition - Pathologic: Stage IA (pT1b, pN0, cM0, G2, ER+, PR+, HER2-) - Signed by Loa Socks, NP on 06/13/2020 Histologic grading system: 3 grade system   06/13/2020 Oncotype testing   Oncotype score: 6: Distant risk of recurrence at 9 years: 3%   07/05/2020 -  Radiation Therapy   Adjuvant radiation     CHIEF COMPLIANT: Follow-up of left breast cancer to discuss antiestrogen therapy  INTERVAL HISTORY: [name] is a 60 y.o. with above-mentioned  history of left breast cancer who underwent a left lumpectomy and is currently on radiation. She presents to the clinic today to discuss antiestrogen therapy.  She tolerated radiation extremely well without any problems or concerns.  She had mild radiation dermatitis.  ALLERGIES:  has No Known Allergies.  MEDICATIONS:  Current Outpatient Medications  Medication Sig Dispense Refill  . Ascorbic Acid (VITAMIN C) 1000 MG tablet Take 1,000 mg by mouth daily.    . Cholecalciferol (VITAMIN D3) 50 MCG (2000 UT) TABS Take 4,000 Units by mouth in the morning.    Marland Kitchen HYDROcodone-acetaminophen (NORCO/VICODIN) 5-325 MG tablet Take 1 tablet by mouth every 6 (six) hours as needed for moderate pain. (Patient not taking: Reported on 06/26/2020) 15 tablet 0  . ibuprofen (ADVIL) 800 MG tablet Take 1 tablet (800 mg total) by mouth every 8 (eight) hours as needed. (Patient not taking: Reported on 06/26/2020) 30 tablet 0  . LORazepam (ATIVAN) 0.5 MG tablet Take 1 tablet (0.5 mg total) by mouth every 8 (eight) hours. (Patient not taking: Reported on 06/26/2020) 20 tablet 0  . tretinoin (RETIN-A) 0.05 % cream Apply 1 application topically at bedtime. (Patient not taking: Reported on 06/26/2020)    . XIIDRA 5 % SOLN Place 1 drop into both eyes in the morning.    . Zinc 50 MG TABS Take 50 mg by mouth in the morning.     No current facility-administered medications for this visit.    PHYSICAL EXAMINATION: ECOG PERFORMANCE STATUS: 1 - Symptomatic but completely ambulatory  Vitals:   08/01/20 1101  BP: (!) 159/63  Pulse: 66  Resp: 18  Temp: 97.6 F (36.4 C)  SpO2: 99%   Filed Weights   08/01/20 1101  Weight: 131 lb 6.4 oz (59.6 kg)      LABORATORY DATA:  I have reviewed the data as listed CMP Latest Ref Rng & Units 05/23/2020 05/09/2020 03/13/2016  Glucose 70 - 99 mg/dL 85 95 69  BUN 6 - 20 mg/dL $Remove'17 18 20  'oBsDhZx$ Creatinine 0.44 - 1.00 mg/dL 0.72 0.82 0.90  Sodium 135 - 145 mmol/L 139 142 140  Potassium 3.5 - 5.1  mmol/L 3.9 3.9 4.3  Chloride 98 - 111 mmol/L 105 106 102  CO2 22 - 32 mmol/L $RemoveB'26 27 25  'XwVEwMZN$ Calcium 8.9 - 10.3 mg/dL 9.5 9.5 9.0  Total Protein 6.5 - 8.1 g/dL 6.7 7.1 6.6  Total Bilirubin 0.3 - 1.2 mg/dL 1.8(H) 1.5(H) 1.6(H)  Alkaline Phos 38 - 126 U/L 62 74 61  AST 15 - 41 U/L $Remo'22 18 19  'PqChx$ ALT 0 - 44 U/L $Remo'16 15 13    'OcGRy$ Lab Results  Component Value Date   WBC 6.1 05/23/2020   HGB 14.6 05/23/2020   HCT 44.2 05/23/2020   MCV 91.1 05/23/2020   PLT 271 05/23/2020   NEUTROABS 4.2 05/23/2020    ASSESSMENT & PLAN:  Malignant neoplasm of lower-outer quadrant of left breast of female, estrogen receptor positive ([place]) 05/04/2020:Screening mammogram showed a left breast asymmetry. Diagnostic mammogram and US showed a 0.3cm mass at the 6 o'clock position and no left axillary adenopathy. 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  Pathology counseling: I discussed the final pathology report of the patient provided  a copy of this report. I discussed the margins as well as lymph node surgeries. We also discussed the final staging along with previously performed ER/PR and HER-2/neu testing.  Treatment Plan: 1. Oncotype Dx: 6 (distant recurrence at 9 years: 3%)  2. Adj XRT started 07/05/2020-08/01/2020 3. Adj Anti estrogen therapy with Anastrozole  Anastrozole counseling: We discussed the risks and benefits of anti-estrogen therapy with aromatase inhibitors. These include but not limited to insomnia, hot flashes, mood changes, vaginal dryness, bone density loss, and weight gain. We strongly believe that the benefits far outweigh the risks. Patient understands these risks and consented to starting treatment. Planned treatment duration is 5-7 years.  Return to clinic in 3 months  for survivorship care plan visit   No orders of the defined types were placed in this encounter.  The patient has a good understanding of the overall plan. she agrees with it. she will call with any problems that may develop before the next visit here.  Total time spent: 20 mins including face to face time and time spent for planning, charting and coordination of care  Rulon Eisenmenger, MD, MPH 08/01/2020  I, Cloyde Reams Dorshimer, am acting as scribe for Dr. Nicholas Lose.  I have reviewed the above documentation for accuracy and completeness, and I agree with the above.

## 2020-07-31 NOTE — Assessment & Plan Note (Signed)
05/04/2020:Screening mammogram showed a left breast asymmetry. Diagnostic mammogram and US showed a 0.3cm mass at the 6 o'clock position and no left axillary adenopathy. 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  Pathology counseling: I discussed the final pathology report of the patient provided  a copy of this report. I discussed the margins as well as lymph node surgeries. We also discussed the final staging along with previously performed ER/PR and HER-2/neu testing.  Treatment Plan: 1. Oncotype Dx: 6 (distant recurrence at 9 years: 3%)  2. Adj XRT started 07/05/2020-08/01/2020 3. Adj Anti estrogen therapy with Anastrozole  Anastrozole counseling: We discussed the risks and benefits of anti-estrogen therapy with aromatase inhibitors. These include but not limited to insomnia, hot flashes, mood changes, vaginal dryness, bone density loss, and weight gain. We strongly believe that the benefits far outweigh the risks. Patient understands these risks and consented to starting treatment. Planned treatment duration is 5-7 years.  Return to clinic in 3 months for survivorship care plan visit

## 2020-08-01 ENCOUNTER — Encounter: Payer: Self-pay | Admitting: Radiation Oncology

## 2020-08-01 ENCOUNTER — Ambulatory Visit
Admission: RE | Admit: 2020-08-01 | Discharge: 2020-08-01 | Disposition: A | Discharge status: Home / Self Care | Payer: BC Managed Care – PPO | Source: Ambulatory Visit | Attending: Radiation Oncology | Admitting: Radiation Oncology

## 2020-08-01 ENCOUNTER — Telehealth: Payer: Self-pay | Admitting: Hematology and Oncology

## 2020-08-01 ENCOUNTER — Inpatient Hospital Stay: Payer: BC Managed Care – PPO | Attending: Hematology and Oncology | Admitting: Hematology and Oncology

## 2020-08-01 DIAGNOSIS — Z79899 Other long term (current) drug therapy: Secondary | ICD-10-CM | POA: Insufficient documentation

## 2020-08-01 DIAGNOSIS — Z170 Estrogen receptor positive status [ER+]: Secondary | ICD-10-CM | POA: Insufficient documentation

## 2020-08-01 DIAGNOSIS — C50512 Malignant neoplasm of lower-outer quadrant of left female breast: Secondary | ICD-10-CM | POA: Diagnosis present

## 2020-08-01 DIAGNOSIS — M8580 Other specified disorders of bone density and structure, unspecified site: Secondary | ICD-10-CM | POA: Insufficient documentation

## 2020-08-01 MED ORDER — ALENDRONATE SODIUM 70 MG PO TABS: 70.0000 mg | ORAL_TABLET | ORAL | 3 refills | Status: DC

## 2020-08-01 MED ORDER — ANASTROZOLE 1 MG PO TABS: 1.0000 mg | ORAL_TABLET | Freq: Every day | ORAL | 3 refills | Status: DC

## 2020-08-01 NOTE — Telephone Encounter (Signed)
Scheduled appointment per 06/01 los. Patient is aware.

## 2020-08-03 ENCOUNTER — Encounter: Payer: Self-pay | Admitting: *Deleted

## 2020-08-15 ENCOUNTER — Encounter: Payer: Self-pay | Admitting: Hematology and Oncology

## 2020-08-22 ENCOUNTER — Telehealth: Payer: Self-pay | Admitting: Hematology and Oncology

## 2020-08-22 NOTE — Progress Notes (Signed)
HEMATOLOGY-ONCOLOGY TELEPHONE VISIT PROGRESS NOTE  I connected with [name] on 08/23/2020 at  1:45 PM EDT by telephone and verified that I am speaking with the correct person using two identifiers.  I discussed the limitations, risks, security and privacy concerns of performing an evaluation and management service by telephone and the availability of in person appointments.  I also discussed with the patient that there may be a patient responsible charge related to this service. The patient expressed understanding and agreed to proceed.   History of Present Illness: [name] is a 60 y.o. female with above-mentioned history of left breast cancer who underwent a left lumpectomy and is currently on radiation. She presents via telephone today to discuss antiestrogen therapy.  She has not started anastrozole because last week she had COVID infection and she has just recovered from it.  She is very much afraid of adverse effects of antiestrogen treatments especially the effects on the bones as well as increase in cholesterol.  She has been reading a lot of literature and is worried about joint stiffness and achiness and this affects on her.  She does not want to take Fosamax.  Oncology History  Malignant neoplasm of lower-outer quadrant of left breast of female, estrogen receptor positive ([place])  05/04/2020 Initial Diagnosis   Screening mammogram showed a left breast asymmetry. Diagnostic mammogram and US showed a 0.3cm mass at the 6 o'clock position and no left axillary adenopathy. Biopsy showed invasive and in situ ductal carcinoma, grade 2, HER-2 equivocal by IHC, negative by FISH (ratio 1.27), ER+ 95%, PR+ 90%, Ki67 10%.   05/09/2020 Cancer Staging   Staging form: Breast, AJCC 8th Edition - Clinical stage from 05/09/2020: Stage IA (cT1a, cN0, cM0, G1, ER+, PR+, HER2-) - Signed by Nicholas Lose, MD on 05/09/2020  Stage prefix: Initial diagnosis    05/29/2020 Surgery   Left lumpectomy  ([facility]): IDC with DCIS, 0.9cm, grade 2, clear margins, 3 left axillary lymph nodes negative for carcinoma.   06/13/2020 Cancer Staging   Staging form: Breast, AJCC 8th Edition - Pathologic: Stage IA (pT1b, pN0, cM0, G2, ER+, PR+, HER2-) - Signed by Gardenia Phlegm, NP on 06/13/2020  Histologic grading system: 3 grade system    06/13/2020 Oncotype testing   Oncotype score: 6: Distant risk of recurrence at 9 years: 3%   07/05/2020 -  Radiation Therapy   Adjuvant radiation     Observations/Objective:     Assessment Plan:  Malignant neoplasm of lower-outer quadrant of left breast of female, estrogen receptor positive ([place]) 05/04/2020:Screening mammogram showed a left breast asymmetry. Diagnostic mammogram and US showed a 0.3cm mass at the 6 o'clock position and no left axillary adenopathy. Biopsy showed invasive and in situ ductal carcinoma, grade 2, HER-2 equivocal by IHC, negative by FISH (ratio 1.27), ER+ 95%, PR+ 90%, Ki67 10%   05/29/20: Left lumpectomy ([facility]): IDC with DCIS, 0.9cm, grade 2, clear margins, 3 left axillary lymph nodes negative for carcinoma.   Treatment Plan: 1. Oncotype Dx: 6 (distant recurrence at 9 years: 3%)  2. Adj XRT started 07/05/2020-08/01/2020 3. Adj Anti estrogen therapy with Anastrozole will start 08/31/20   Breast Cancer Surveillance: 1. Breast Exam: to be done annually 2. Mammograms: to be done Feb 2023 Patient did not take anastrozole yet because she is still very concerned about side effects of treatment.  She is read lots of information and is quite anxious about taking it. She also does not want to take Fosamax.  She  does have osteopenia.  We decided to watch and monitor and recheck a bone density next year in February.  Return to clinic for survivorship care plan visit in September.   I discussed the assessment and treatment plan with the patient. The patient was provided an opportunity to ask questions and all were answered. The patient  agreed with the plan and demonstrated an understanding of the instructions. The patient was advised to call back or seek an in-person evaluation if the symptoms worsen or if the condition fails to improve as anticipated.   Total time spent: 20 mins including non-face to face time and time spent for planning, charting and coordination of care  Rulon Eisenmenger, MD 08/23/2020    I, Thana Ates, am acting as scribe for Nicholas Lose, MD.  I have reviewed the above documentation for accuracy and completeness, and I agree with the above.

## 2020-08-22 NOTE — Assessment & Plan Note (Addendum)
05/04/2020:Screening mammogram showed a left breast asymmetry. Diagnostic mammogram and US showed a 0.3cm mass at the 6 o'clock position and no left axillary adenopathy. Biopsy showed invasive and in situ ductal carcinoma, grade 2, HER-2 equivocal by IHC, negative by FISH (ratio 1.27), ER+ 95%, PR+ 90%, Ki67 10%  05/29/20:Left lumpectomy ([facility]): IDC with DCIS, 0.9cm, grade 2, clear margins, 3 left axillary lymph nodes negative for carcinoma.  Treatment Plan: 1. Oncotype Dx: 6 (distant recurrence at 9 years: 3%)  2. Adj XRT started 07/05/2020-08/01/2020 3. Adj Anti estrogen therapy with Anastrozole  Anastrozole Toxicities:  Breast Cancer Surveillance: 1. Breast Exam: to be done annually 2. Mammograms: to be done Feb 2023

## 2020-08-22 NOTE — Telephone Encounter (Signed)
Scheduled appt per 6/22 sch msg. Pt aware.  

## 2020-08-23 ENCOUNTER — Inpatient Hospital Stay (HOSPITAL_BASED_OUTPATIENT_CLINIC_OR_DEPARTMENT_OTHER): Payer: BC Managed Care – PPO | Admitting: Hematology and Oncology

## 2020-08-23 DIAGNOSIS — Z170 Estrogen receptor positive status [ER+]: Secondary | ICD-10-CM | POA: Diagnosis not present

## 2020-08-23 DIAGNOSIS — C50512 Malignant neoplasm of lower-outer quadrant of left female breast: Secondary | ICD-10-CM | POA: Diagnosis not present

## 2020-09-10 ENCOUNTER — Ambulatory Visit: Payer: BC Managed Care – PPO | Attending: Surgery | Admitting: Physical Therapy

## 2020-09-10 ENCOUNTER — Encounter: Payer: Self-pay | Admitting: Physical Therapy

## 2020-09-10 ENCOUNTER — Other Ambulatory Visit: Payer: Self-pay

## 2020-09-10 ENCOUNTER — Ambulatory Visit: Payer: Self-pay

## 2020-09-10 DIAGNOSIS — Z483 Aftercare following surgery for neoplasm: Secondary | ICD-10-CM | POA: Insufficient documentation

## 2020-09-10 NOTE — Therapy (Signed)
[place], [place], [address] Phone: [phone]   Fax:  [phone]  Physical Therapy Treatment  Patient Details  Name: [name] MRN: [id number] Date of Birth: [birth year] Referring Provider (PT): Dr. 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   Gilbert's syndrome    pt denies problems   History of kidney stones    Infertility    Seen by RE @ [facility]   PONV (postoperative nausea and vomiting)    Vaginitis, atrophic     Past Surgical History:  Procedure Laterality Date   BREAST LUMPECTOMY WITH RADIOACTIVE SEED AND SENTINEL LYMPH NODE BIOPSY Left 05/29/2020   Procedure: LEFT BREAST LUMPECTOMY WITH RADIOACTIVE SEED AND LEFT SENTINEL LYMPH NODE BIOPSY;  Surgeon: [name], MD;  Location: [place];  Service: General;  Laterality: Left;  P   CESAREAN SECTION  12/17/1993   [name]   DIAGNOSTIC LAPAROSCOPY     HYSTEROSCOPY WITH D & C N/A 03/09/2014   Procedure: DILATATION AND CURETTAGE /HYSTEROSCOPY;  Surgeon: [name], MD;  Location: [facility];  Service: Gynecology;  Laterality: N/A;   kidney stone removal  2010   [place]      There were no vitals filed for this visit.   Subjective Assessment - 09/10/20 1136     Subjective Patient here for SOZO screen                    L-DEX FLOWSHEETS - 09/10/20 1100       L-DEX LYMPHEDEMA SCREENING   Measurement Type Unilateral    L-DEX MEASUREMENT EXTREMITY Upper  Extremity    POSITION  Standing    DOMINANT SIDE Right    At Risk Side Left    BASELINE SCORE (UNILATERAL) -4.1    L-DEX SCORE (UNILATERAL) 0.1    VALUE CHANGE (UNILAT) 4.2                                    PT Long Term Goals - 06/25/20 1451       PT LONG TERM GOAL #1   Title Patient will demonstrate she has regained full shoulder ROM and function post operatively compared to baselines.    Time 8    Period Weeks    Status Achieved                   Plan - 09/10/20 1137     Clinical Impression Statement Within good range with SOZO    PT Next Visit Plan Continue SOZO screens    Consulted and Agree with Plan of Care Patient             Patient will benefit from skilled therapeutic intervention in order to improve  the following deficits and impairments:     Visit Diagnosis: Aftercare following surgery for neoplasm     Problem List Patient Active Problem List   Diagnosis Date Noted   Malignant neoplasm of lower-outer quadrant of left breast of female, estrogen receptor positive ([place]) 05/04/2020   Postmenopausal bleeding 11/15/2013   Hx of abnormal cervical Pap smear 09/17/2010   Endometrial hyperplasia, simple 09/17/2010   Annia Friendly, PT 09/10/20 11:38 AM   [place], [place], [address] Phone: [phone]   Fax:  [phone]  Name: [name] MRN: [id number] Date of Birth: 1960-12-31

## 2020-09-19 ENCOUNTER — Ambulatory Visit: Payer: Self-pay | Admitting: Radiation Oncology

## 2020-09-24 ENCOUNTER — Telehealth: Payer: Self-pay

## 2020-09-24 NOTE — Telephone Encounter (Signed)
I called the patient today about her upcoming follow-up appointment in radiation oncology.   Given the state of the COVID-19 pandemic, concerning case numbers in our community, and guidance from [facility], I offered a phone assessment with the patient to determine if coming to the clinic was necessary. She accepted.  I let the patient know that I had spoken with Dr. Isidore Moos, and she wanted them to know the importance of washing their hands for at least 20 seconds at a time, especially after going out in public, and before they eat.  Limit going out in public whenever possible. Do not touch your face, unless your hands are clean, such as when bathing. Get plenty of rest, eat well, and stay hydrated. Patient verbalized understanding and agreement.   The patient denies any symptomatic concerns.  She reports she "feels great" and has returned to her daily activity/routine without difficulty. She denies any range of motion concerns to her left arm/shoulder. She reports occasional "pin prick" pains to her breast, but states they resolve quickly on their own. Specifically, she reports good healing of her skin in the radiation fields.  Skin is intact and back to baseline color/texture. I recommended that she continue skin care by applying oil or lotion with vitamin E to the skin in the radiation fields, BID, for 2 more months.    Continue follow-up with medical oncology - follow-up is scheduled on 11/01/2020 with Dr. Nicholas Lose in [place].  I explained that yearly mammograms are important for patients with intact breast tissue, and physical exams are important after mastectomy for patients that cannot undergo mammography.  I encouraged her to call if she had further questions or concerns about her healing. Otherwise, she will follow-up PRN in radiation oncology. Patient is pleased with this plan, and we will cancel her upcoming follow-up to reduce the risk of COVID-19  transmission.

## 2020-09-25 ENCOUNTER — Ambulatory Visit: Payer: BC Managed Care – PPO | Admitting: Radiation Oncology

## 2020-09-26 NOTE — Progress Notes (Signed)
                                                                                                                                                             Patient Name: [name] MRN: [id number] DOB: 01/01/1961 Referring Physician: Elenor Quinones Date of Service: 08/01/2020 [facility], [place]                                                        End Of Treatment Note  Diagnoses: C50.512-Malignant neoplasm of lower-outer quadrant of left female breast  Cancer Staging: Cancer Staging Malignant neoplasm of lower-outer quadrant of left breast of female, estrogen receptor positive ([place]) Staging form: Breast, AJCC 8th Edition - Clinical stage from 05/09/2020: Stage IA (cT1a, cN0, cM0, G1, ER+, PR+, HER2-) - Signed by Nicholas Lose, MD on 05/09/2020 Stage prefix: Initial diagnosis - Pathologic: Stage IA (pT1b, pN0, cM0, G2, ER+, PR+, HER2-) - Signed by Gardenia Phlegm, NP on 06/13/2020 Histologic grading system: 3 grade system  Intent: Curative  Radiation Treatment Dates: 07/04/2020 through 08/01/2020 Site Technique Total Dose (Gy) Dose per Fx (Gy) Completed Fx Beam Energies  Breast, Left: Breast_Lt 3D 40.05/40.05 2.67 15/15 6X  Breast, Left: Breast_Lt_Bst 3D 10/10 2 5/5 6X   Narrative: The patient tolerated radiation therapy relatively well.   Plan: The patient will follow-up with radiation oncology in [place] PRN. -----------------------------------  [name] MD

## 2020-09-28 ENCOUNTER — Ambulatory Visit: Payer: BC Managed Care – PPO | Admitting: Radiation Oncology

## 2020-10-31 ENCOUNTER — Other Ambulatory Visit: Payer: Self-pay

## 2020-10-31 ENCOUNTER — Ambulatory Visit (INDEPENDENT_AMBULATORY_CARE_PROVIDER_SITE_OTHER): Payer: BC Managed Care – PPO | Admitting: Obstetrics and Gynecology

## 2020-10-31 ENCOUNTER — Encounter: Payer: Self-pay | Admitting: Obstetrics and Gynecology

## 2020-10-31 VITALS — BP 171/79 | HR 79 | Ht 61.0 in | Wt 131.0 lb

## 2020-10-31 DIAGNOSIS — M62838 Other muscle spasm: Secondary | ICD-10-CM

## 2020-10-31 DIAGNOSIS — R350 Frequency of micturition: Secondary | ICD-10-CM | POA: Diagnosis not present

## 2020-10-31 DIAGNOSIS — N941 Unspecified dyspareunia: Secondary | ICD-10-CM | POA: Diagnosis not present

## 2020-10-31 DIAGNOSIS — R82998 Other abnormal findings in urine: Secondary | ICD-10-CM

## 2020-10-31 DIAGNOSIS — N952 Postmenopausal atrophic vaginitis: Secondary | ICD-10-CM

## 2020-10-31 LAB — URINALYSIS
Bilirubin, UA: NEGATIVE
Glucose, UA: NEGATIVE
Ketones, UA: NEGATIVE
Nitrite, UA: POSITIVE — AB
Specific Gravity, UA: 1.009 (ref 1.005–1.030)
Urobilinogen, Ur: 0.2 mg/dL (ref 0.2–1.0)
pH, UA: 7 (ref 5.0–7.5)

## 2020-10-31 LAB — POCT URINALYSIS DIPSTICK
Appearance: ABNORMAL
Bilirubin, UA: NEGATIVE
Glucose, UA: NEGATIVE
Ketones, UA: NEGATIVE
Nitrite, UA: POSITIVE
Protein, UA: NEGATIVE
Spec Grav, UA: 1.02 (ref 1.010–1.025)
Urobilinogen, UA: 0.2 E.U./dL
pH, UA: 7 (ref 5.0–8.0)

## 2020-10-31 MED ORDER — DIAZEPAM 5 MG PO TABS: ORAL_TABLET | ORAL | 0 refills | Status: DC

## 2020-10-31 NOTE — Patient Instructions (Addendum)
Vulvovaginal moisturizer Options: Vitamin E oil (pump or capsule) or cream (Gene's Vit E Cream) Coconut oil Silicone-based lubricant for use during intercourse ("wet platinum" is a brand available at most drugstores) Crisco Consider the ingredients of the product - the fewer the ingredients the better!  Directions for Use: Clean and dry your hands Gently dab the vulvar/vaginal area dry as needed Apply a "pea-sized" amount of the moisturizer onto your fingertip Using you other hand, open the labia  Apply the moisturizer to the vulvar/vaginal tissues Wear loose fitting underwear/clothing if possible following application Use moisturize up to 3 times daily as desired.   I will prescribe valium 5 mg pills to place vaginally up to 2 times a day for vaginal muscle spasms. Start at night to see if it causes drowsiness. Once you are improving you can taper off the medication and just use as needed. If the medication makes you drowsy then only use at bedtime and/or we can reduce the dose. Do not use gel to place it, as that will prevent the tablet from dissolving.  Instead place 1-2 drops of water on the table before inserting it in the vagina. If the pills do not dissolve well we can switch to a special compounded suppository. Let me know how you are doing on the medication and if you have any questions.

## 2020-10-31 NOTE — Progress Notes (Addendum)
[name]  Referring Provider: Sloan Leiter, MD PCP: Wayland Salinas, MD Date of Service: 10/31/2020  SUBJECTIVE Chief Complaint: New Patient (Initial Visit)  History of Present Illness: [name] is a 60 y.o. White or Caucasian female seen in [place] at the request of Dr. Rosana Hoes for evaluation of dyspareunia.    Review of records from Dr Rosana Hoes significant for: Has tried dilators for dyspareunia. Also has done [employer] and bonafide revaree.   Recently diagnosed with breast cancer, ER and PR positive. Had lumpectomy and radiation. On arimidex.   Urinary Symptoms: Does not leak urine.   Day time voids 5.  Nocturia: 1 times per night to void. Voiding dysfunction: she empties her bladder well.  does not use a catheter to empty bladder.  When urinating, she feels she has no difficulties  UTIs:  0  UTI's in the last year.   Denies history of blood in urine and kidney or bladder stones  Pelvic Organ Prolapse Symptoms:                  She Denies a feeling of a bulge the vaginal area.   Bowel Symptom: Bowel movements:every 2-3 days Stool consistency: hard or soft  Straining: yes, sometimes Splinting: no.  Incomplete evacuation: no.  She Denies accidental bowel leakage / fecal incontinence Bowel regimen: none Last colonoscopy: 8 years ago  Sexual Function Sexually active: yes.  Sexual orientation: Straight Pain with sex: at the vaginal opening, has discomfort due to dryness Tried estrogen pills, dilators, mona lisa.  Uses slippery stuff lubricant  Pelvic Pain Denies pelvic pain    Past Medical History:  Past Medical History:  Diagnosis Date   Abnormal Pap smear    ASCUS/LGSIL   Breast cancer ([place]) 05/2020   left breast   Chronic kidney disease    horse shoe shaped kidneys   Gilbert's syndrome    pt denies problems   History of kidney stones    Infertility    Seen by RE @ [facility]   PONV  (postoperative nausea and vomiting)    Vaginitis, atrophic      Past Surgical History:   Past Surgical History:  Procedure Laterality Date   BREAST LUMPECTOMY WITH RADIOACTIVE SEED AND SENTINEL LYMPH NODE BIOPSY Left 05/29/2020   Procedure: LEFT BREAST LUMPECTOMY WITH RADIOACTIVE SEED AND LEFT SENTINEL LYMPH NODE BIOPSY;  Surgeon: [name], MD;  Location: [place];  Service: General;  Laterality: Left;  P   CESAREAN SECTION  12/17/1993   [name]   DIAGNOSTIC LAPAROSCOPY     HYSTEROSCOPY WITH D & C N/A 03/09/2014   Procedure: DILATATION AND CURETTAGE /HYSTEROSCOPY;  Surgeon: [name], MD;  Location: [facility];  Service: Gynecology;  Laterality: N/A;   kidney stone removal  2010   monalisa       Past OB/GYN History: OB History     Gravida  2   Para  1   Term  1   Preterm      AB  1   Living  1      SAB  1   IAB      Ectopic      Multiple      Live Births  1           Vaginal deliveries: 0  Forceps/ Vacuum deliveries: 0, Cesarean section: 1 Menopausal: Yes, at age 37, Denies vaginal bleeding since menopause Last pap smear was  03/2020- ASCUS.     Medications: She has a current medication list which includes the following prescription(s): anastrozole, vitamin c, calcium carbonate, vitamin d3, diazepam, vitamin k2, tretinoin, xiidra, and zinc.   Allergies: Patient has No Known Allergies.   Social History:  Social History   Tobacco Use   Smoking status: Never   Smokeless tobacco: Never  Vaping Use   Vaping Use: Never used  Substance Use Topics   Alcohol use: No   Drug use: No    Relationship status: married She lives with husband and daughter.   She is not employed . Regular exercise: Yes: biking, walking History of abuse: No  Family History:   Family History  Problem Relation Age of Onset   Hypertension Father    Diabetes Mother    Colon cancer Maternal Grandfather      Review of Systems: Review of Systems  Constitutional:   Negative for fever, malaise/fatigue and weight loss.  Respiratory:  Negative for cough, shortness of breath and wheezing.   Cardiovascular:  Negative for chest pain, palpitations and leg swelling.  Gastrointestinal:  Negative for abdominal pain and blood in stool.  Genitourinary:  Negative for dysuria.  Musculoskeletal:  Negative for myalgias.  Skin:  Negative for rash.  Neurological:  Positive for dizziness. Negative for headaches.  Endo/Heme/Allergies:  Does not bruise/bleed easily.  Psychiatric/Behavioral:  Negative for depression. The patient is not nervous/anxious.     OBJECTIVE Physical Exam: Vitals:   10/31/20 1049  BP: (!) 171/79  Pulse: 79  Weight: 131 lb (59.4 kg)  Height: '5\' 1"'$  (1.549 m)    Physical Exam Constitutional:      General: She is not in acute distress. Pulmonary:     Effort: Pulmonary effort is normal.  Abdominal:     General: There is no distension.     Palpations: Abdomen is soft.     Tenderness: There is no abdominal tenderness. There is no rebound.  Musculoskeletal:        General: No swelling. Normal range of motion.  Skin:    General: Skin is warm and dry.     Findings: No rash.  Neurological:     Mental Status: She is alert and oriented to person, place, and time.  Psychiatric:        Mood and Affect: Mood normal.        Behavior: Behavior normal.     GU / Detailed Urogynecologic Evaluation:  Pelvic Exam: Normal external female genitalia; Bartholin's and Skene's glands normal in appearance; urethral meatus normal in appearance, no urethral masses or discharge.   CST: negative  Speculum exam reveals normal vaginal mucosa with atrophy. Cervix normal appearance. Uterus normal single, nontender. Adnexa no mass, fullness, tenderness.     Pelvic floor musculature: Tenderness at puborectalis. Right levator tender, Right obturator tender, Left levator tender, Left obturator tender  POP-Q:  No prolapse   Rectal Exam:  Normal external  rectum  Post-Void Residual (PVR) by Bladder Scan: In order to evaluate bladder emptying, we discussed obtaining a postvoid residual and she agreed to this procedure.  Procedure: The ultrasound unit was placed on the patient's abdomen in the suprapubic region after the patient had voided. A PVR of 77 ml was obtained by bladder scan.  Laboratory Results: POC urine: large leukocytes,positive nitrites, small blood   ASSESSMENT AND PLAN Ms. Siron is a 60 y.o. with:  1. Dyspareunia, female   2. Levator spasm   3. Urinary frequency   4. Leukocytes in  urine   5. Vaginal atrophy    Dyspareunia/ levator spasm The origin of pelvic floor muscle spasm can be multifactorial, including primary, reactive to a different pain source, trauma, or even part of a centralized pain syndrome.Treatment options include pelvic floor physical therapy, local (vaginal) or oral  muscle relaxants, pelvic muscle trigger point injections or centrally acting pain medications.   - She is interested in pelvic PT, referral placed - Will prescribe vaginal valium '5mg'$  tablets to use prior to intercourse or physical therapy - Showed her dilators and pelvic wand which she can use for pelvic floor muscle relaxation at home  2. Leukocytes in urine - will send for UA and culture  3. Vaginal atrophy - Discussed using coconut oil nightly for moisture  Return 2 months to assess progress  Jaquita Folds, MD   Medical Decision Making:  - Reviewed/ ordered a clinical laboratory tes  - Review and summation of prior records

## 2020-11-01 ENCOUNTER — Inpatient Hospital Stay: Payer: BC Managed Care – PPO | Attending: Hematology and Oncology | Admitting: Adult Health

## 2020-11-01 ENCOUNTER — Encounter: Payer: Self-pay | Admitting: Adult Health

## 2020-11-01 VITALS — BP 167/79 | HR 85 | Temp 98.1°F | Resp 18 | Ht 61.0 in | Wt 128.4 lb

## 2020-11-01 DIAGNOSIS — C50512 Malignant neoplasm of lower-outer quadrant of left female breast: Secondary | ICD-10-CM | POA: Diagnosis not present

## 2020-11-01 DIAGNOSIS — Z79811 Long term (current) use of aromatase inhibitors: Secondary | ICD-10-CM | POA: Diagnosis not present

## 2020-11-01 DIAGNOSIS — Z170 Estrogen receptor positive status [ER+]: Secondary | ICD-10-CM | POA: Diagnosis not present

## 2020-11-01 DIAGNOSIS — M8580 Other specified disorders of bone density and structure, unspecified site: Secondary | ICD-10-CM | POA: Diagnosis not present

## 2020-11-01 NOTE — Progress Notes (Signed)
SURVIVORSHIP VISIT:    BRIEF ONCOLOGIC HISTORY:  Oncology History  Malignant neoplasm of lower-outer quadrant of left breast of female, estrogen receptor positive ([place])  05/04/2020 Initial Diagnosis   Screening mammogram showed a left breast asymmetry. Diagnostic mammogram and US showed a 0.3cm mass at the 6 o'clock position and no left axillary adenopathy. Biopsy showed invasive and in situ ductal carcinoma, grade 2, HER-2 equivocal by IHC, negative by FISH (ratio 1.27), ER+ 95%, PR+ 90%, Ki67 10%.   05/09/2020 Cancer Staging   Staging form: Breast, AJCC 8th Edition - Clinical stage from 05/09/2020: Stage IA (cT1a, cN0, cM0, G1, ER+, PR+, HER2-) - Signed by Nicholas Lose, MD on 05/09/2020 Stage prefix: Initial diagnosis   05/29/2020 Surgery   Left lumpectomy ([place]): IDC with DCIS, 0.9cm, grade 2, clear margins, 3 left axillary lymph nodes negative for carcinoma.   06/13/2020 Cancer Staging   Staging form: Breast, AJCC 8th Edition - Pathologic: Stage IA (pT1b, pN0, cM0, G2, ER+, PR+, HER2-) - Signed by Gardenia Phlegm, NP on 06/13/2020 Histologic grading system: 3 grade system   06/13/2020 Oncotype testing   Oncotype score: 6: Distant risk of recurrence at 9 years: 3%   07/04/2020 - 08/01/2020 Radiation Therapy   Adjuvant radiation     INTERVAL HISTORY:  [name] to review her survivorship care plan detailing her treatment course for breast cancer, as well as monitoring long-term side effects of that treatment, education regarding health maintenance, screening, and overall wellness and health promotion.     Overall, [name] reports feeling quite well.  She is taking Anastrozole daily and is tolerating it quite well.  She has chronic vaginal dryness and dyspareunia for which she sees urogynecology regularly.  She denies any new issues from Anastrozole such as hot flashes, arthralgias.  She is having some word finding/memory difficulty since her diagnosis and wonders what that is  related to.  She recalls that she underwent bone density in 04/2020 and it showed mild osteopenia with a T score of -2.0 and she was recommended to start Fosamax.  She has opted instead to work on calcium, vitamin d, and weight bearing exercises.  Overall she has no concerns and is doing quite well today.  REVIEW OF SYSTEMS:  Review of Systems  Constitutional:  Negative for appetite change, chills, fatigue, fever and unexpected weight change.  HENT:   Negative for hearing loss, lump/mass and trouble swallowing.   Eyes:  Negative for eye problems and icterus.  Respiratory:  Negative for chest tightness, cough and shortness of breath.   Cardiovascular:  Negative for chest pain, leg swelling and palpitations.  Gastrointestinal:  Negative for abdominal distention, abdominal pain, constipation, diarrhea, nausea and vomiting.  Endocrine: Negative for hot flashes.  Genitourinary:  Negative for difficulty urinating.   Musculoskeletal:  Negative for arthralgias.  Skin:  Negative for itching and rash.  Neurological:  Negative for dizziness, extremity weakness, headaches and numbness.  Hematological:  Negative for adenopathy. Does not bruise/bleed easily.  Psychiatric/Behavioral:  Negative for depression. The patient is not nervous/anxious.   Breast: Denies any new nodularity, masses, tenderness, nipple changes, or nipple discharge.      ONCOLOGY TREATMENT TEAM:  1. Surgeon:  Dr. Brantley Stage at [facility] 2. Medical Oncologist: Dr. Lindi Adie  3. Radiation Oncologist: Dr. Isidore Moos    PAST MEDICAL/SURGICAL HISTORY:  Past Medical History:  Diagnosis Date   Abnormal Pap smear    ASCUS/LGSIL   Breast cancer ([place]) 05/2020   left breast  Chronic kidney disease    horse shoe shaped kidneys   Gilbert's syndrome    pt denies problems   History of kidney stones    Infertility    Seen by RE @ [facility]   PONV (postoperative nausea and vomiting)    Vaginitis, atrophic    Past Surgical History:   Procedure Laterality Date   BREAST LUMPECTOMY WITH RADIOACTIVE SEED AND SENTINEL LYMPH NODE BIOPSY Left 05/29/2020   Procedure: LEFT BREAST LUMPECTOMY WITH RADIOACTIVE SEED AND LEFT SENTINEL LYMPH NODE BIOPSY;  Surgeon: [name], MD;  Location: [place];  Service: General;  Laterality: Left;  P   CESAREAN SECTION  12/17/1993   [name]   DIAGNOSTIC LAPAROSCOPY     HYSTEROSCOPY WITH D & C N/A 03/09/2014   Procedure: DILATATION AND CURETTAGE /HYSTEROSCOPY;  Surgeon: [name], MD;  Location: [facility];  Service: Gynecology;  Laterality: N/A;   kidney stone removal  2010   monalisa       ALLERGIES:  No Known Allergies   CURRENT MEDICATIONS:  Outpatient Encounter Medications as of 11/01/2020  Medication Sig   anastrozole (ARIMIDEX) 1 MG tablet Take 1 tablet (1 mg total) by mouth daily.   Ascorbic Acid (VITAMIN C) 1000 MG tablet Take 1,000 mg by mouth daily.   calcium carbonate (OS-CAL) 600 MG TABS tablet    Cholecalciferol (VITAMIN D3) 50 MCG (2000 UT) TABS Take 4,000 Units by mouth in the morning.   diazepam (VALIUM) 5 MG tablet Place 1 tablet vaginally nightly as needed for muscle spasm/ pelvic pain.   Menatetrenone (VITAMIN K2) 100 MCG TABS    tretinoin (RETIN-A) 0.05 % cream Apply 1 application topically at bedtime.   XIIDRA 5 % SOLN Place 1 drop into both eyes in the morning.   Zinc 50 MG TABS Take 50 mg by mouth in the morning.   No facility-administered encounter medications on file as of 11/01/2020.     ONCOLOGIC FAMILY HISTORY:  Family History  Problem Relation Age of Onset   Hypertension Father    Diabetes Mother    Colon cancer Maternal Grandfather      GENETIC COUNSELING/TESTING: Not at this time  SOCIAL HISTORY:  Social History   Socioeconomic History   Marital status: Married    Spouse name: Not on file   Number of children: Not on file   Years of education: Not on file   Highest education level: Not on file  Occupational History   Occupation:  Product manager: [employer]  Tobacco Use   Smoking status: Never   Smokeless tobacco: Never  Vaping Use   Vaping Use: Never used  Substance and Sexual Activity   Alcohol use: No   Drug use: No   Sexual activity: Yes    Partners: Male    Birth control/protection: Post-menopausal  Other Topics Concern   Not on file  Social History Narrative   Not on file   Social Determinants of Health   Financial Resource Strain: Not on file  Food Insecurity: Not on file  Transportation Needs: Not on file  Physical Activity: Not on file  Stress: Not on file  Social Connections: Not on file  Intimate Partner Violence: Not on file     OBSERVATIONS/OBJECTIVE:  BP (!) 167/79 (BP Location: Right Arm, Patient Position: Sitting)   Pulse 85   Temp 98.1 F (36.7 C) (Temporal)   Resp 18   Ht $R'5\' 1"'gf$  (1.549 m)   Wt 128  lb 6.4 oz (58.2 kg)   SpO2 99%   BMI 24.26 kg/m  GENERAL: Patient is a well appearing female in no acute distress HEENT:  Sclerae anicteric.  Oropharynx clear and moist. No ulcerations or evidence of oropharyngeal candidiasis. Neck is supple.  NODES:  No cervical, supraclavicular, or axillary lymphadenopathy palpated.  BREAST EXAM:  left breast s/p lumpectomy and radiation, no sign of local recurrence, right breast benign LUNGS:  Clear to auscultation bilaterally.  No wheezes or rhonchi. HEART:  Regular rate and rhythm. No murmur appreciated. ABDOMEN:  Soft, nontender.  Positive, normoactive bowel sounds. No organomegaly palpated. MSK:  No focal spinal tenderness to palpation. Full range of motion bilaterally in the upper extremities. EXTREMITIES:  No peripheral edema.   SKIN:  Clear with no obvious rashes or skin changes. No nail dyscrasia. NEURO:  Nonfocal. Well oriented.  Appropriate affect.   LABORATORY DATA:  None for this visit.  DIAGNOSTIC IMAGING:  None for this visit.      ASSESSMENT AND PLAN:  Ms.. Gibson is a pleasant 60 y.o. female  with Stage IA left breast invasive ductal carcinoma, ER+/PR+/HER2-, diagnosed in 04/2020, treated with lumpectomy, adjuvant radiation therapy, and anti-estrogen therapy with Anastrozole beginning in 08/2020.  She presents to the Survivorship Clinic for our initial meeting and routine follow-up post-completion of treatment for breast cancer.    1. Stage IA left breast cancer:  [name] is continuing to recover from definitive treatment for breast cancer. She will follow-up with her medical oncologist, Dr. Lindi Adie in 6 months with history and physical exam per surveillance protocol.  She will continue her anti-estrogen therapy with Anastrozole. Thus far, she is tolerating the Anastrozole well, with minimal side effects. She was instructed to make Dr. Lindi Adie or myself aware if she begins to experience any worsening side effects of the medication and I could see her back in clinic to help manage those side effects, as needed. Her mammogram is due 04/2021; orders placed today. Today, a comprehensive survivorship care plan and treatment summary was reviewed with the patient today detailing her breast cancer diagnosis, treatment course, potential late/long-term effects of treatment, appropriate follow-up care with recommendations for the future, and patient education resources.  A copy of this summary, along with a letter will be sent to the patient's primary care provider via mail/fax/In Basket message after today's visit.    2.  Word finding difficulty: This is mild and likely related to an increase in stress related to her diagnosis.  We discussed ways to improve this with yoga, meditation, tai chi, mindfulness, adequate sleep.  She will let me know if it worsens, we can always refer her to our neuro oncologist, Dr. Mickeal Skinner if so.    3. Bone health:  Given [name]'s age/history of breast cancer and her current treatment regimen including anti-estrogen therapy with Anastrozole, she is at risk for bone  demineralization.  Her last DEXA scan was 04/2020, which showed osteopenia with a T score of -2.0.  She is due for repeat in 04/2022.  In the meantime, she was encouraged to increase her consumption of foods rich in calcium, as well as increase her weight-bearing activities.  She was given education on specific activities to promote bone health.  3. Cancer screening:  Due to [name]'s history and her age, she should receive screening for skin cancers, colon cancer, and gynecologic cancers.  The information and recommendations are listed on the patient's comprehensive care plan/treatment summary and were reviewed in detail  with the patient.    4. Health maintenance and wellness promotion: [name] was encouraged to consume 5-7 servings of fruits and vegetables per day.   She was also encouraged to engage in moderate to vigorous exercise for 30 minutes per day most days of the week. We discussed the [employer] fitness program, which is designed for cancer survivors to help them become more physically fit after cancer treatments.  She was instructed to limit her alcohol consumption and continue to abstain from tobacco use.      5. Support services/counseling: It is not uncommon for this period of the patient's cancer care trajectory to be one of many emotions and stressors.  W She was given information regarding our available services and encouraged to contact me with any questions or for help enrolling in any of our support group/programs.    Follow up instructions:    -Return to cancer center 6 months for f/u with Dr. Lindi Adie  -Mammogram due in 04/2021 -Bone density in 04/2022 -Follow up with surgery 1 year -She is welcome to return back to the Survivorship Clinic at any time; no additional follow-up needed at this time.  -Consider referral back to survivorship as a long-term survivor for continued surveillance  The patient was provided an opportunity to ask questions and all were answered. The  patient agreed with the plan and demonstrated an understanding of the instructions.   Total encounter time: 45 minutes in SCP preparation, chart review, imaging review, care coordination, order entry, face to face visit time, and documentation of the encounter*   Wilber Bihari, NP 11/01/20 11:07 AM Medical Oncology and Hematology [facility] [place], [place] [address] Tel. [phone]    Fax. [phone]  *Total Encounter Time as defined by the Centers for Medicare and Medicaid Services includes, in addition to the face-to-face time of a patient visit (documented in the note above) non-face-to-face time: obtaining and reviewing outside history, ordering and reviewing medications, tests or procedures, care coordination (communications with other health care professionals or caregivers) and documentation in the medical record.

## 2020-11-04 ENCOUNTER — Encounter: Payer: Self-pay | Admitting: Adult Health

## 2020-11-06 LAB — URINE CULTURE

## 2020-11-06 MED ORDER — NITROFURANTOIN MONOHYD MACRO 100 MG PO CAPS: 100.0000 mg | ORAL_CAPSULE | Freq: Two times a day (BID) | ORAL | 0 refills | Status: AC

## 2020-11-06 NOTE — Addendum Note (Signed)
Addended by: Jaquita Folds on: 11/06/2020 11:22 AM   Modules accepted: Orders

## 2020-12-07 ENCOUNTER — Telehealth: Payer: Self-pay | Admitting: *Deleted

## 2020-12-07 NOTE — Telephone Encounter (Signed)
Received call from pt with complaint of rash and itching to left breast. Pt states she has started using a Vitamin E cream on her breast and noticed the irritation occurred shortly after.  Per MD pt to stop using Vitamin E cream and apply hydrocortisone cream to affected area.  Pt educated to call the office if symptoms become worse and does not go away.  Pt verbalized understanding.

## 2020-12-10 ENCOUNTER — Ambulatory Visit: Payer: BC Managed Care – PPO | Attending: Family Medicine

## 2020-12-10 ENCOUNTER — Other Ambulatory Visit: Payer: Self-pay

## 2020-12-10 VITALS — Wt 135.5 lb

## 2020-12-10 DIAGNOSIS — R278 Other lack of coordination: Secondary | ICD-10-CM | POA: Insufficient documentation

## 2020-12-10 DIAGNOSIS — Z483 Aftercare following surgery for neoplasm: Secondary | ICD-10-CM | POA: Insufficient documentation

## 2020-12-10 DIAGNOSIS — R252 Cramp and spasm: Secondary | ICD-10-CM | POA: Insufficient documentation

## 2020-12-10 NOTE — Therapy (Addendum)
[place] @ [place], [place], [address] Phone: [phone]   Fax:  [phone]  Physical Therapy Treatment  Patient Details  Name: CRYSTALYNN MCINERNEY MRN: [id number] Date of Birth: 01-Feb-1961 Referring Provider (PT): Dr. [name]   Encounter Date: 12/10/2020   PT End of Session - 12/10/20 1532     Visit Number 2   # unchanged due to screen only   PT Start Time 5427    PT Stop Time 1532    PT Time Calculation (min) 7 min    Activity Tolerance Patient tolerated treatment well    Behavior During Therapy [facility] for tasks assessed/performed             Past Medical History:  Diagnosis Date   Abnormal Pap smear    ASCUS/LGSIL   Breast cancer ([place]) 05/2020   left breast   Chronic kidney disease    horse shoe shaped kidneys   Gilbert's syndrome    pt denies problems   History of kidney stones    Infertility    Seen by RE @ [facility]   PONV (postoperative nausea and vomiting)    Vaginitis, atrophic     Past Surgical History:  Procedure Laterality Date   BREAST LUMPECTOMY WITH RADIOACTIVE SEED AND SENTINEL LYMPH NODE BIOPSY Left 05/29/2020   Procedure: LEFT BREAST LUMPECTOMY WITH RADIOACTIVE SEED AND LEFT SENTINEL LYMPH NODE BIOPSY;  Surgeon: [name], MD;  Location: [place];  Service: General;  Laterality: Left;  P   CESAREAN SECTION  12/17/1993   [name]   DIAGNOSTIC LAPAROSCOPY     HYSTEROSCOPY WITH D & C N/A 03/09/2014   Procedure: DILATATION AND CURETTAGE /HYSTEROSCOPY;  Surgeon: [name], MD;  Location: [facility];  Service: Gynecology;  Laterality: N/A;   kidney stone removal  2010   monalisa      Vitals:   12/10/20 1523  Weight: 135 lb 8 oz (61.5 kg)     Subjective Assessment - 12/10/20 1524     Subjective Pt returns for her 3 month screen.    Pertinent History Patient was diagnosed on 04/19/2020 with left grade II invasive ductal carcinoma breast cancer. She had left lumpectomy  and sentinel node biopsy (3 negative nodes) on 05/29/2020. It is ER/PR positive and HER2 negative with a Ki67 of 10%.                    L-DEX FLOWSHEETS - 12/10/20 1500       L-DEX LYMPHEDEMA SCREENING   Measurement Type Unilateral    L-DEX MEASUREMENT EXTREMITY Upper Extremity    POSITION  Standing    DOMINANT SIDE Right    At Risk Side Left    BASELINE SCORE (UNILATERAL) -4.1    L-DEX SCORE (UNILATERAL) 1.8    VALUE CHANGE (UNILAT) 5.9                                     PT Long Term Goals - 06/25/20 1451       PT LONG TERM GOAL #1   Title Patient will demonstrate she has regained full shoulder ROM and function post operatively compared to baselines.    Time 8    Period Weeks    Status Achieved                   Plan -  12/10/20 1741     Clinical Impression Statement Pt returns for her 3 month L-Dex screen. Her change from baseline of 5.9 is WNLs, though close to subclinical lymphedema level, so no further treatment is required at this time. However, did suggest to pt that it would be a good preventative action to begin wearing her compression sleeve during periods of increased activity, like exercising or daily walks. She verbalized understanding and agreed. She will benefot from continuing every 3 month L-Dex screens which she is agreeable to. Also briefly instructed pt in donning her compression sleeve as she had this with her today.    PT Next Visit Plan Cont every 3 month L-Dex screens for up to 2 years from her SLNB (~05/30/22)    Consulted and Agree with Plan of Care Patient             Patient will benefit from skilled therapeutic intervention in order to improve the following deficits and impairments:     Visit Diagnosis: Aftercare following surgery for neoplasm     Problem List Patient Active Problem List   Diagnosis Date Noted   Malignant neoplasm of lower-outer quadrant of left breast of female, estrogen  receptor positive ([place]) 05/04/2020   Postmenopausal bleeding 11/15/2013   Hx of abnormal cervical Pap smear 09/17/2010   Endometrial hyperplasia, simple 09/17/2010    Otelia Limes, PTA 12/10/2020, 5:55 PM  [place] @ [place], [place], [address] Phone: [phone]   Fax:  [phone]  Name: GIANAH BATT MRN: [id number] Date of Birth: 1960-05-31

## 2020-12-24 ENCOUNTER — Ambulatory Visit: Payer: BC Managed Care – PPO | Admitting: Physical Therapy

## 2020-12-24 ENCOUNTER — Other Ambulatory Visit: Payer: Self-pay

## 2020-12-24 ENCOUNTER — Encounter: Payer: Self-pay | Admitting: Physical Therapy

## 2020-12-24 DIAGNOSIS — R278 Other lack of coordination: Secondary | ICD-10-CM

## 2020-12-24 DIAGNOSIS — R252 Cramp and spasm: Secondary | ICD-10-CM | POA: Diagnosis present

## 2020-12-24 DIAGNOSIS — Z483 Aftercare following surgery for neoplasm: Secondary | ICD-10-CM

## 2020-12-24 NOTE — Therapy (Signed)
[place] @ [place], [place], [address] Phone: [phone]   Fax:  [phone]  Physical Therapy Evaluation  Patient Details  Name: [name] MRN: [id number] Date of Birth: March 31, 1960 Referring Provider (PT): Dr. Sherlene Shams   Encounter Date: 12/24/2020   PT End of Session - 12/24/20 1117     Visit Number 3   1 pelvic floor; 2 breast   Date for PT Re-Evaluation 90/38/33   breast cert 05/08/3289   Authorization Type BCBS    PT Start Time 1015    PT Stop Time 1055    PT Time Calculation (min) 40 min    Activity Tolerance Patient tolerated treatment well;No increased pain    Behavior During Therapy [name] for tasks assessed/performed             Past Medical History:  Diagnosis Date   Abnormal Pap smear    ASCUS/LGSIL   Breast cancer ([place]) 05/2020   left breast   Chronic kidney disease    horse shoe shaped kidneys   Gilbert's syndrome    pt denies problems   History of kidney stones    Infertility    Seen by RE @ [facility]   PONV (postoperative nausea and vomiting)    Vaginitis, atrophic     Past Surgical History:  Procedure Laterality Date   BREAST LUMPECTOMY WITH RADIOACTIVE SEED AND SENTINEL LYMPH NODE BIOPSY Left 05/29/2020   Procedure: LEFT BREAST LUMPECTOMY WITH RADIOACTIVE SEED AND LEFT SENTINEL LYMPH NODE BIOPSY;  Surgeon: [name], MD;  Location: [place];  Service: General;  Laterality: Left;  P   CESAREAN SECTION  12/17/1993   [name]   DIAGNOSTIC LAPAROSCOPY     HYSTEROSCOPY WITH D & C N/A 03/09/2014   Procedure: DILATATION AND CURETTAGE /HYSTEROSCOPY;  Surgeon: [name], MD;  Location: [facility];  Service: Gynecology;  Laterality: N/A;   kidney stone removal  2010   [place]alisa      There were no vitals filed for this visit.    Subjective Assessment - 12/24/20 1021     Subjective Patient has had painful intercourse. Has been going prior to the breast cancer. I  have used the estrogen internally. I have a valium to use vaginally. My dilators are hard plastic and did not like using them. They use a pediatric speculum. Started with menopause. I tense up du eto knowing I will have pain with intercourse.    Pertinent History Patient was diagnosed on 04/19/2020 with left grade II invasive ductal carcinoma breast cancer. She had left lumpectomy and sentinel node biopsy (3 negative nodes) on 05/29/2020. It is ER/PR positive and HER2 negative with a Ki67 of 10%.    Patient Stated Goals See how my arm is doing; reduce pelvic pain with intercourse    Currently in Pain? Yes    Pain Score 8     Pain Location Vagina    Pain Orientation Mid    Pain Descriptors / Indicators Tightness    Pain Type Chronic pain    Pain Onset More than a month ago    Pain Frequency Intermittent    Aggravating Factors  during a vaginal exam; penile penetration vaginally    Pain Relieving Factors no vaginal penetration    Multiple Pain Sites No                [facility] PT Assessment - 12/24/20 0001       Assessment  Medical Diagnosis N94.10 Dyspareunia, female    Referring Provider (PT) Dr. Sherlene Shams    Onset Date/Surgical Date --   several year ago     Precautions   Precautions Other (comment)    Precaution Comments recent surgery and left arm lymphedema risk      Restrictions   Weight Bearing Restrictions No      Balance Screen   Has the patient fallen in the past 6 months No    Has the patient had a decrease in activity level because of a fear of falling?  No    Is the patient reluctant to leave their home because of a fear of falling?  No      Home Environment   Living Environment Private residence    Living Arrangements Spouse/significant other    Available Help at Discharge Family      Prior Function   Level of [place] Unemployed    Leisure She walks and rides a stationary bike for 1 hour daily      Cognition   Overall  Cognitive Status Within Functional Limits for tasks assessed      Posture/Postural Control   Posture/Postural Control Postural limitations    Postural Limitations Rounded Shoulders;Forward head      ROM / Strength   AROM / PROM / Strength AROM;PROM;Strength      AROM   Lumbar Extension decreased by 50%    Lumbar - Right Side Bend decreased by 25%    Lumbar - Left Side Bend decreased by 25%    Lumbar - Right Rotation decreased by 25%    Lumbar - Left Rotation decreased by 25%      PROM   Right Hip External Rotation  40    Left Hip External Rotation  40      Strength   Right Hip Extension 4/5    Right Hip ABduction 3+/5    Left Hip Extension 4/5    Left Hip ABduction 4/5      Palpation   Spinal mobility decreased movement of T10-L5    Palpation comment tightness in the lower abdomen                        Objective measurements completed on examination: See above findings.     Pelvic Floor Special Questions - 12/24/20 0001     Prior Pregnancies Yes    Number of Pregnancies 2   1 miscarriage   Number of C-Sections 1    Currently Sexually Active Yes    Is this Painful Yes    Marinoff Scale pain prevents any attempts at intercourse    Urinary Leakage No    Urinary urgency No    Urinary frequency fully empty her bladder    Fecal incontinence No   sometimes has to strain   Falling out feeling (prolapse) No    Skin Integrity Intact;Other    External Palpation dryness    Pelvic Floor Internal Exam Patient confirms identification and approves PT to assess pelvic floor and treatment    Exam Type Vaginal    Palpation tenderness located in bilateral levator ani and obturator internist with left > right; decreased mobility of the right posterior vaignal canal    Strength weak squeeze, no lift   2/5 anterior and posterior; 1/5 laterally   Tone increased              [facility] Adult PT Treatment/Exercise -  12/24/20 0001       Self-Care   Self-Care Other  Self-Care Comments    Other Self-Care Comments  educated patient on perineal massage to elongate the pelvic floor muscles                     PT Education - 12/24/20 1206     Education Details educated patient on perineal massage    Person(s) Educated Patient    Methods Explanation;Demonstration    Comprehension Verbalized understanding              PT Short Term Goals - 12/24/20 1156       PT SHORT TERM GOAL #1   Title Independent with self perineal massage and hip stretches    Time 4    Period Weeks    Status New    Target Date 01/21/21      PT SHORT TERM GOAL #2   Title educated on vaginal health, moisturizers, lubricants,    Time 4    Period Weeks    Status New    Target Date 01/21/21      PT SHORT TERM GOAL #3   Title able to bulge the pelvic floor due to abiity to perfrom diaphragmatic breathing to move the diaphragm    Time 4    Period Weeks    Status New    Target Date 01/21/21               PT Long Term Goals - 12/24/20 1159       PT LONG TERM GOAL #2   Title Independent with advanced HEP and understand how to continue with her dilators to keep the vaginal walls expanded    Time 4    Period Months    Status New    Target Date 04/26/21      PT LONG TERM GOAL #3   Title able to use the largest dilator and no pain greater than 3/10 so she is able to vaginal penetration with minimal to no pain    Time 4    Period Months    Status New    Target Date 04/26/21      PT LONG TERM GOAL #4   Title able to have penile penetration vaginally with her husband using appropriate lubricant with pain level </= 1/10    Time 4    Period Months    Target Date 04/26/21      PT LONG TERM GOAL #5   Title full lumbar ROM so her pelvis is able to move with greater ease and lengthen the pelvic floor muscles    Time 4    Period Months    Status New    Target Date 04/26/21                    Plan - 12/24/20 1105     Clinical  Impression Statement Patient is a 60 year old female with dyspareunia for several years after she went through menopause. Patient reports she needs a pediatric speculum for vaginal exam. Her pain with intercourse is 8/10. She has tried estrace, dilators and Josph Macho procedure but has not helped. Pelvic floor strength is 2/5 anterior and posterior and 1/5 laterally. Patient is not able to bulge her pelvic floor. Tenderness located in bilateral levator ani and obturator internist with left > right. She has tightness in the posterior right vaginal wall. Citoral hood is starting to attach to the clitorus.  Bilateral hips external rotation is limited. Her lumbar ROM is limited by 25% and extension is 50%. Patient was able to tolerate the therapist pinky and index finger placed into the vaginal canal. Patient will beneift from skilled therapy to improve lengthening of the pelvic floor to reduce pain with vaginal penetration.    Personal Factors and Comorbidities Comorbidity 1    Comorbidities Left breast cancer; Left breast lumpectomy with radioactive seed and sentinal lmph node biopsy; on Arimidex; c-section 12/17/1993    Examination-Participation Restrictions Interpersonal Relationship   vaginal exam   Stability/Clinical Decision Making Stable/Uncomplicated    Clinical Decision Making Low    Rehab Potential Excellent    PT Frequency 1x / week    PT Duration Other (comment)   4 months   PT Treatment/Interventions Biofeedback;Functional mobility training;Therapeutic activities;Therapeutic exercise;Neuromuscular re-education;Patient/family education;Manual techniques;Passive range of motion;Dry needling;Spinal Manipulations;Joint Manipulations    PT Next Visit Plan hip stretches; pelvic floor meditation; infomation on vaginal moisturizers; manual work on the thighs, gluteal; discuss dilators and sizes from intimate rose; go over vaginal health    Consulted and Agree with Plan of Care Patient              Patient will benefit from skilled therapeutic intervention in order to improve the following deficits and impairments:  Decreased coordination, Decreased range of motion, Increased fascial restricitons, Pain, Decreased strength, Decreased activity tolerance, Decreased endurance  Visit Diagnosis: Aftercare following surgery for neoplasm - Plan: PT plan of care cert/re-cert  Cramp and spasm - Plan: PT plan of care cert/re-cert  Other lack of coordination - Plan: PT plan of care cert/re-cert     Problem List Patient Active Problem List   Diagnosis Date Noted   Malignant neoplasm of lower-outer quadrant of left breast of female, estrogen receptor positive ([place]) 05/04/2020   Postmenopausal bleeding 11/15/2013   Hx of abnormal cervical Pap smear 09/17/2010   Endometrial hyperplasia, simple 09/17/2010    Earlie Counts, PT 12/24/20 12:09 PM  [place] @ [place], [place], [address] Phone: [phone]   Fax:  [phone]  Name: [name] MRN: [id number] Date of Birth: 05-01-1960

## 2020-12-24 NOTE — Patient Instructions (Signed)
Place thumb in the vaginal canal and stroke from 11:00 ot 6:00 and 1:0 to 6:00 30 times each side Use left thumb for right side and reverse Then place thumb in vaginal canal and index finger on the back of the vaginal and massage  Then massage around the vulva with circular motion  [facility] [address], [place], [place] [address] Phone # [phone] Fax [phone]

## 2020-12-25 ENCOUNTER — Encounter: Payer: BC Managed Care – PPO | Admitting: Physical Therapy

## 2020-12-26 DIAGNOSIS — R6889 Other general symptoms and signs: Secondary | ICD-10-CM | POA: Insufficient documentation

## 2021-01-01 ENCOUNTER — Ambulatory Visit: Payer: BC Managed Care – PPO | Admitting: Obstetrics and Gynecology

## 2021-01-02 ENCOUNTER — Ambulatory Visit: Payer: BC Managed Care – PPO | Attending: Family Medicine | Admitting: Physical Therapy

## 2021-01-02 ENCOUNTER — Encounter: Payer: Self-pay | Admitting: Physical Therapy

## 2021-01-02 ENCOUNTER — Other Ambulatory Visit: Payer: Self-pay

## 2021-01-02 DIAGNOSIS — Z170 Estrogen receptor positive status [ER+]: Secondary | ICD-10-CM | POA: Diagnosis present

## 2021-01-02 DIAGNOSIS — Z483 Aftercare following surgery for neoplasm: Secondary | ICD-10-CM | POA: Insufficient documentation

## 2021-01-02 DIAGNOSIS — R252 Cramp and spasm: Secondary | ICD-10-CM | POA: Insufficient documentation

## 2021-01-02 DIAGNOSIS — R278 Other lack of coordination: Secondary | ICD-10-CM | POA: Insufficient documentation

## 2021-01-02 DIAGNOSIS — C50512 Malignant neoplasm of lower-outer quadrant of left female breast: Secondary | ICD-10-CM | POA: Diagnosis present

## 2021-01-02 NOTE — Patient Instructions (Addendum)
Moisturizers They are used in the vagina to hydrate the mucous membrane that make up the vaginal canal. Designed to keep a more normal acid balance (ph) Once placed in the vagina, it will last between two to three days.  Use 2-3 times per week at bedtime  Ingredients to avoid is glycerin and fragrance, can increase chance of infection Should not be used just before sex due to causing irritation Most are gels administered either in a tampon-shaped applicator or as a vaginal suppository. They are non-hormonal.   Types of Moisturizers(internal use) 2 times per week  Vitamin E vaginal suppositories- Whole foods, Amazon Moist Again Coconut oil- can break down condoms Julva- (Do no use if on Tamoxifen) amazon Yes moisturizer- amazon NeuEve Silk , NeuEve Silver for menopausal or over 65 (if have severe vaginal atrophy or cancer treatments use NeuEve Silk for  1 month than move to [employer])- [employer], [place].com Olive and Bee intimate cream- [email or web address].com.au Mae vaginal moisturizer- Amazon Aloe   Access Code: [email or web address] URL: [email or web address] Date: 01/02/2021 Prepared by: Earlie Counts  Exercises Double Leg Hamstring Stretch at Wall - 1 x daily - 7 x weekly - 1 sets - 1 reps - 1 min hold  [place] [address], [place], [place] [address] Phone # [phone] Fax [phone]   Creams to use externally on the Vulva area [facility] Releveum (good for for cancer patients that had radiation to the area)- [place] or [employer].FlyingBasics.com.br V-magic cream - amazon Julva-amazon Vital "V [place] salve ( help moisturize and help with thinning vulvar area, does have [place] by Irwin Brakeman labial moisturizer (Amazon,  Coconut or olive oil aloe   Things to avoid in the vaginal area Do not use things to irritate the vulvar area No lotions just specialized  creams for the vulva area- Neogyn, V-magic, No soaps; can use Aveeno or Calendula cleanser if needed. Must be gentle No deodorants No douches Good to sleep without underwear to let the vaginal area to air out No scrubbing: spread the lips to let warm water rinse over labias and pat dry

## 2021-01-02 NOTE — Therapy (Signed)
[facility] @ [facility] [place], [place], [address] Phone: [phone]   Fax:  [phone]  Physical Therapy Treatment  Patient Details  Name: [name] MRN: [id number] Date of Birth: 1961/01/14 Referring Provider (PT): Dr. Sherlene Shams   Encounter Date: 01/02/2021   PT End of Session - 01/02/21 1155     Visit Number 4   2 pelvic floor, 2 breast   Date for PT Re-Evaluation 04/26/21   breast 07/02/2020   Authorization Type BCBS    PT Start Time 2703    PT Stop Time 1223    PT Time Calculation (min) 38 min    Activity Tolerance Patient tolerated treatment well;No increased pain    Behavior During Therapy [facility] for tasks assessed/performed             Past Medical History:  Diagnosis Date   Abnormal Pap smear    ASCUS/LGSIL   Breast cancer ([place]) 05/2020   left breast   Chronic kidney disease    horse shoe shaped kidneys   Gilbert's syndrome    pt denies problems   History of kidney stones    Infertility    Seen by RE @ [facility]   PONV (postoperative nausea and vomiting)    Vaginitis, atrophic     Past Surgical History:  Procedure Laterality Date   BREAST LUMPECTOMY WITH RADIOACTIVE SEED AND SENTINEL LYMPH NODE BIOPSY Left 05/29/2020   Procedure: LEFT BREAST LUMPECTOMY WITH RADIOACTIVE SEED AND LEFT SENTINEL LYMPH NODE BIOPSY;  Surgeon: [name], MD;  Location: [facility];  Service: General;  Laterality: Left;  P   CESAREAN SECTION  12/17/1993   [name]   DIAGNOSTIC LAPAROSCOPY     HYSTEROSCOPY WITH D & C N/A 03/09/2014   Procedure: DILATATION AND CURETTAGE /HYSTEROSCOPY;  Surgeon: [name], MD;  Location: [facility];  Service: Gynecology;  Laterality: N/A;   kidney stone removal  2010   monalisa      There were no vitals filed for this visit.   Subjective Assessment - 01/02/21 1151     Subjective I am doing well. I had one session for my knee rehab.    Pertinent History Patient was diagnosed  on 04/19/2020 with left grade II invasive ductal carcinoma breast cancer. She had left lumpectomy and sentinel node biopsy (3 negative nodes) on 05/29/2020. It is ER/PR positive and HER2 negative with a Ki67 of 10%.    Patient Stated Goals See how my arm is doing; reduce pelvic pain with intercourse    Currently in Pain? Yes    Pain Score 8     Pain Location Vagina    Pain Orientation Mid    Pain Descriptors / Indicators Tightness    Pain Type Chronic pain    Pain Onset More than a month ago    Pain Frequency Intermittent    Aggravating Factors  during a vaginal exam, penile penetration vaginally    Pain Relieving Factors no vaginal penetration    Multiple Pain Sites No                            Pelvic Floor Special Questions - 01/02/21 0001     Pelvic Floor Internal Exam Patient confirms identification and approves PT to assess pelvic floor and treatment    Exam Type Vaginal               [facility] Adult  PT Treatment/Exercise - 01/02/21 0001       Self-Care   Self-Care Other Self-Care Comments    Other Self-Care Comments  educated patient on vaginal moisturizers and health to improve tissue health; educated on purchasing the medium size dilaotrs      Lumbar Exercises: Stretches   Active Hamstring Stretch Right;Left;1 rep;30 seconds    Active Hamstring Stretch Limitations supine    Other Lumbar Stretch Exercise supine with legs on wall and performins diaphragmatic breathing to relax the pelvic floor      Lumbar Exercises: Aerobic   Nustep 5  minutes to assess for progress; not using the left arm level 5      Manual Therapy   Manual Therapy Myofascial release    Myofascial Release fascial release along the urogenital diaphgram and levator ani                     PT Education - 01/02/21 1214     Education Details educated patient on vaginal health and moisturizers;Access Code: [email or web address]    Person(s) Educated Patient    Methods  Explanation;Demonstration;Handout    Comprehension Verbalized understanding;Returned demonstration              PT Short Term Goals - 01/02/21 1240       PT SHORT TERM GOAL #1   Title Independent with self perineal massage and hip stretches    Time 4    Period Weeks    Status Achieved      PT SHORT TERM GOAL #2   Title educated on vaginal health, moisturizers, lubricants,    Time 4    Period Weeks    Status On-going      PT SHORT TERM GOAL #3   Title able to bulge the pelvic floor due to abiity to perfrom diaphragmatic breathing to move the diaphragm    Time 4    Period Weeks    Status On-going               PT Long Term Goals - 12/24/20 1159       PT LONG TERM GOAL #2   Title Independent with advanced HEP and understand how to continue with her dilators to keep the vaginal walls expanded    Time 4    Period Months    Status New    Target Date 04/26/21      PT LONG TERM GOAL #3   Title able to use the largest dilator and no pain greater than 3/10 so she is able to vaginal penetration with minimal to no pain    Time 4    Period Months    Status New    Target Date 04/26/21      PT LONG TERM GOAL #4   Title able to have penile penetration vaginally with her husband using appropriate lubricant with pain level </= 1/10    Time 4    Period Months    Target Date 04/26/21      PT LONG TERM GOAL #5   Title full lumbar ROM so her pelvis is able to move with greater ease and lengthen the pelvic floor muscles    Time 4    Period Months    Status New    Target Date 04/26/21                   Plan - 01/02/21 1158     Clinical Impression Statement Patient is doing her HEP.  Patient is going to order the medium size dilators. Patientis able to do diaphragmatic breathing and expand her pelvic floor. Patient just started therapyso she has not met goals at this time. She uderstand about vaginal moisturizers and how the help the vaginal health. Patient will  benefit from skilled therapy to improve lengthening of the pelvic floor to reduce pain with vaginal penetration.    Personal Factors and Comorbidities Comorbidity 1    Comorbidities Left breast cancer; Left breast lumpectomy with radioactive seed and sentinal lmph node biopsy; on Arimidex; c-section 12/17/1993    Examination-Participation Restrictions Interpersonal Relationship    Stability/Clinical Decision Making Stable/Uncomplicated    Rehab Potential Excellent    PT Frequency 1x / week    PT Duration Other (comment)   4 months   PT Treatment/Interventions Biofeedback;Functional mobility training;Therapeutic activities;Therapeutic exercise;Neuromuscular re-education;Patient/family education;Manual techniques;Passive range of motion;Dry needling;Spinal Manipulations;Joint Manipulations    PT Next Visit Plan manual work on the thighs, gluteal; instruction on dilators; manual work on perineum; work on bulging the pelvic floor    PT Home Exercise Plan Post op shoulder ROM HEP;Access Code: [email or web address]    Recommended Other Services MD signed initial eval    Consulted and Agree with Plan of Care Patient             Patient will benefit from skilled therapeutic intervention in order to improve the following deficits and impairments:  Decreased coordination, Decreased range of motion, Increased fascial restricitons, Pain, Decreased strength, Decreased activity tolerance, Decreased endurance  Visit Diagnosis: Other lack of coordination  Cramp and spasm  Malignant neoplasm of lower-outer quadrant of left breast of female, estrogen receptor positive ([place])     Problem List Patient Active Problem List   Diagnosis Date Noted   Malignant neoplasm of lower-outer quadrant of left breast of female, estrogen receptor positive ([place]) 05/04/2020   Postmenopausal bleeding 11/15/2013   Hx of abnormal cervical Pap smear 09/17/2010   Endometrial hyperplasia, simple 09/17/2010    Earlie Counts,  PT 01/02/21 12:42 PM  [facility] @ [place], [place], [address] Phone: [phone]   Fax:  [phone]  Name: [name] MRN: [id number] Date of Birth: 09-05-1960

## 2021-01-09 ENCOUNTER — Encounter: Payer: Self-pay | Admitting: Physical Therapy

## 2021-01-09 ENCOUNTER — Other Ambulatory Visit: Payer: Self-pay

## 2021-01-09 ENCOUNTER — Ambulatory Visit: Payer: BC Managed Care – PPO | Admitting: Physical Therapy

## 2021-01-09 DIAGNOSIS — R252 Cramp and spasm: Secondary | ICD-10-CM

## 2021-01-09 DIAGNOSIS — R278 Other lack of coordination: Secondary | ICD-10-CM

## 2021-01-09 DIAGNOSIS — C50512 Malignant neoplasm of lower-outer quadrant of left female breast: Secondary | ICD-10-CM

## 2021-01-09 DIAGNOSIS — Z170 Estrogen receptor positive status [ER+]: Secondary | ICD-10-CM

## 2021-01-09 DIAGNOSIS — Z483 Aftercare following surgery for neoplasm: Secondary | ICD-10-CM

## 2021-01-09 NOTE — Therapy (Signed)
[facility] @ [facility] [address] [place], [place], [address] Phone: [phone]   Fax:  [phone]  Physical Therapy Treatment  Patient Details  Name: [name] MRN: [id number] Date of Birth: [birth year] Referring Provider (PT): Dr. Lanetta Inch   Encounter Date: 01/09/2021   PT End of Session - 01/09/21 1117     Visit Number 5   3 pelvic floor 2 breaset   Date for PT Re-Evaluation --   breast 07/2020   Authorization Type BCBS    PT Start Time 1100    PT Stop Time 1150    PT Time Calculation (min) 50 min    Activity Tolerance Patient tolerated treatment well;No increased pain    Behavior During Therapy [facility] for tasks assessed/performed             Past Medical History:  Diagnosis Date   Abnormal Pap smear    ASCUS/LGSIL   Breast cancer ([facility]) 05/2020   left breast   Chronic kidney disease    horse shoe shaped kidneys   Gilbert's syndrome    pt denies problems   History of kidney stones    Infertility    Seen by RE @ [facility]   PONV (postoperative nausea and vomiting)    Vaginitis, atrophic     Past Surgical History:  Procedure Laterality Date   BREAST LUMPECTOMY WITH RADIOACTIVE SEED AND SENTINEL LYMPH NODE BIOPSY Left 05/29/2020   Procedure: LEFT BREAST LUMPECTOMY WITH RADIOACTIVE SEED AND LEFT SENTINEL LYMPH NODE BIOPSY;  Surgeon: Harriette Bouillon, MD;  Location: [facility];  Service: General;  Laterality: Left;  P   CESAREAN SECTION  12/17/1993   [name]   DIAGNOSTIC LAPAROSCOPY     HYSTEROSCOPY WITH D & C N/A 03/09/2014   Procedure: DILATATION AND CURETTAGE /HYSTEROSCOPY;  Surgeon: Allie Bossier, MD;  Location: [facility];  Service: Gynecology;  Laterality: N/A;   kidney stone removal  2010   monalisa      There were no vitals filed for this visit.   Subjective Assessment - 01/09/21 1107     Subjective I felt fine after last visit.    Pertinent History Patient was diagnosed on 04/19/2020 with left grade II  invasive ductal carcinoma breast cancer. She had left lumpectomy and sentinel node biopsy (3 negative nodes) on 05/29/2020. It is ER/PR positive and HER2 negative with a Ki67 of 10%.    Patient Stated Goals See how my arm is doing; reduce pelvic pain with intercourse    Currently in Pain? Yes    Pain Score 8     Pain Location Vagina    Pain Orientation Mid    Pain Descriptors / Indicators Tightness    Pain Type Chronic pain    Pain Onset More than a month ago    Pain Frequency Intermittent    Aggravating Factors  during a vaginal exam, penile penetration vaginally    Pain Relieving Factors no vaginal penetration    Multiple Pain Sites No                            Pelvic Floor Special Questions - 01/09/21 0001     Pelvic Floor Internal Exam Patient confirms identification and approves PT to assess pelvic floor and treatment    Exam Type Vaginal               [facility] Adult PT Treatment/Exercise - 01/09/21 0001  Self-Care   Self-Care Other Self-Care Comments    Other Self-Care Comments  educated patient on how to use the dilaotr      Lumbar Exercises: Aerobic   Nustep 6 minutes to assess for progress; not using the left arm level 5      Manual Therapy   Manual Therapy Myofascial release;Soft tissue mobilization;Internal Pelvic Floor    Soft tissue mobilization manual work to bilateral hip adductors and quadrucep and along the pubic ram    Myofascial Release fascial release to the urogenital diaphram with [employer] reveleum    Internal Pelvic Floor manual work to the perineal body, along the introitus, and release of the posterior vaginal canal with [employer] Reveleum and monitoring for pain                     PT Education - 01/09/21 1159     Education Details instruction on using dilators; gave patient [employer] Reveleum to use with dilators to reduce pain    Person(s) Educated Patient    Methods  Explanation;Demonstration;Handout    Comprehension Verbalized understanding;Returned demonstration              PT Short Term Goals - 01/02/21 1240       PT SHORT TERM GOAL #1   Title Independent with self perineal massage and hip stretches    Time 4    Period Weeks    Status Achieved      PT SHORT TERM GOAL #2   Title educated on vaginal health, moisturizers, lubricants,    Time 4    Period Weeks    Status On-going      PT SHORT TERM GOAL #3   Title able to bulge the pelvic floor due to abiity to perfrom diaphragmatic breathing to move the diaphragm    Time 4    Period Weeks    Status On-going               PT Long Term Goals - 12/24/20 1159       PT LONG TERM GOAL #2   Title Independent with advanced HEP and understand how to continue with her dilators to keep the vaginal walls expanded    Time 4    Period Months    Status New    Target Date 04/26/21      PT LONG TERM GOAL #3   Title able to use the largest dilator and no pain greater than 3/10 so she is able to vaginal penetration with minimal to no pain    Time 4    Period Months    Status New    Target Date 04/26/21      PT LONG TERM GOAL #4   Title able to have penile penetration vaginally with her husband using appropriate lubricant with pain level </= 1/10    Time 4    Period Months    Target Date 04/26/21      PT LONG TERM GOAL #5   Title full lumbar ROM so her pelvis is able to move with greater ease and lengthen the pelvic floor muscles    Time 4    Period Months    Status New    Target Date 04/26/21                   Plan - 01/09/21 1108     Clinical Impression Statement Patient has purchased the medium set of vaginal dilators. Today she  was able to tolerate the therapist placing her index finger into the vaginal canal for first time. Today she was instructed on using the vaginal dilators with [facility] REveleum. She was able to place the first dilator into the canal  without pain and using the [employer]. Patient was relexed with using the dilator. Patient will benefit from skilled therapy to improve lengthening of the pelvic floor to reduce pain with vaginal penetration.    Personal Factors and Comorbidities Comorbidity 1    Comorbidities Left breast cancer; Left breast lumpectomy with radioactive seed and sentinal lmph node biopsy; on Arimidex; c-section 12/17/1993    Examination-Participation Restrictions Interpersonal Relationship    Stability/Clinical Decision Making Stable/Uncomplicated    Rehab Potential Excellent    PT Frequency 1x / week    PT Duration Other (comment)   4 months   PT Treatment/Interventions Biofeedback;Functional mobility training;Therapeutic activities;Therapeutic exercise;Neuromuscular re-education;Patient/family education;Manual techniques;Passive range of motion;Dry needling;Spinal Manipulations;Joint Manipulations    PT Next Visit Plan manual work on the thighs, gluteal; instruction on dilators; manual work on perineum; work on bulging the pelvic floor    PT Home Exercise Plan Post op shoulder ROM HEP;Access Code: [email or web address]    Consulted and Agree with Plan of Care Patient             Patient will benefit from skilled therapeutic intervention in order to improve the following deficits and impairments:  Decreased coordination, Decreased range of motion, Increased fascial restricitons, Pain, Decreased strength, Decreased activity tolerance, Decreased endurance  Visit Diagnosis: Other lack of coordination  Cramp and spasm  Malignant neoplasm of lower-outer quadrant of left breast of female, estrogen receptor positive [facility])  Aftercare following surgery for neoplasm     Problem List Patient Active Problem List   Diagnosis Date Noted   Malignant neoplasm of lower-outer quadrant of left breast of female, estrogen receptor positive ([place]) 05/04/2020   Postmenopausal bleeding 11/15/2013   Hx of abnormal  cervical Pap smear 09/17/2010   Endometrial hyperplasia, simple 09/17/2010    Earlie Counts, PT 01/09/21 12:06 PM   [facility] @ [place], [place], [address] Phone: [phone]   Fax:  [phone]  Name: DORRENE BENTLY MRN: [id number] Date of Birth: April 22, 1960

## 2021-01-09 NOTE — Patient Instructions (Signed)
PROTOCOL FOR VAGINAL DILATORS   Wash dilator with soap and water prior to insertion.    Lay on your back reclined. Knees are to be up and apart while on your bed or in the bathtub with warm water.   Lubricate the end of the dilator with a water-soluble lubricant.  Separate the labia.   Tense the pelvic floor muscles than relax; while relaxing, slide lubricated dilator( round side of dilator) into the vagina.  Insert toward the direction of your spine. Dilator should feel snug and no pain more than 3/10.   Tense muscles again while holding the dilator so it does not get pushed out; relax and slide it in a little further.   Try blowing out as if filling a balloon; this may relax the muscles and allow penetration.  Repeat blowing out to insert dilator further.  Keep dilator in for 10 minutes if tolerate, with the pelvic floor muscles relaxed to further stretch the canal.   Never force the dilator into the canal. Once the dilator is comfortable start to move in and out, side to side, move your hips in different directions  3-4 times per week Progression of dilator.  When you are able to place dilator into vaginal canal and feel no pain or able to move without difficulty you are ready for the next size.  Before you go to the next size start with the original size for 2 minutes then use the next size up for 5 minutes. When the next size up is easy to use, do not have to start with the smaller size.   [facility] [address], [address] [place], [place] [address] Phone # [phone] Fax [phone]  

## 2021-01-15 ENCOUNTER — Other Ambulatory Visit: Payer: Self-pay | Admitting: *Deleted

## 2021-01-15 ENCOUNTER — Encounter: Payer: Self-pay | Admitting: Physical Therapy

## 2021-01-15 DIAGNOSIS — C50512 Malignant neoplasm of lower-outer quadrant of left female breast: Secondary | ICD-10-CM

## 2021-01-15 NOTE — Progress Notes (Signed)
Received call from pt requesting to meet with genetic counselor to see if she qualify's for any genetic testing.  Pt also requesting to meet with nutritionist to discuss diet post breast cancer diagnosis.  Per MD okay for referrals to be placed.  Referrals placed and scheduling messages sent.

## 2021-01-16 ENCOUNTER — Encounter: Payer: Self-pay | Admitting: Physical Therapy

## 2021-01-16 ENCOUNTER — Telehealth: Payer: Self-pay | Admitting: Physical Therapy

## 2021-01-16 ENCOUNTER — Ambulatory Visit: Payer: BC Managed Care – PPO | Admitting: Physical Therapy

## 2021-01-16 ENCOUNTER — Other Ambulatory Visit: Payer: Self-pay

## 2021-01-16 ENCOUNTER — Telehealth: Payer: Self-pay | Admitting: Hematology and Oncology

## 2021-01-16 DIAGNOSIS — R252 Cramp and spasm: Secondary | ICD-10-CM

## 2021-01-16 DIAGNOSIS — R278 Other lack of coordination: Secondary | ICD-10-CM

## 2021-01-16 DIAGNOSIS — Z483 Aftercare following surgery for neoplasm: Secondary | ICD-10-CM

## 2021-01-16 DIAGNOSIS — Z170 Estrogen receptor positive status [ER+]: Secondary | ICD-10-CM

## 2021-01-16 NOTE — Therapy (Signed)
[facility] @ [place], [place], [address] Phone: [phone]   Fax:  [phone]  Physical Therapy Treatment  Patient Details  Name: [name] MRN: [id number] Date of Birth: 1960/03/07 Referring Provider (PT): Dr. Sherlene Gibson   Encounter Date: 01/16/2021   PT End of Session - 01/16/21 1444     Visit Number 6   4 pelvic, 2 breast   Date for PT Re-Evaluation 04/06/21   breast 5/22   Authorization Type BCBS    PT Start Time 1400    PT Stop Time 1442    PT Time Calculation (min) 42 min    Activity Tolerance Patient tolerated treatment well;No increased pain    Behavior During Therapy [facility] for tasks assessed/performed             Past Medical History:  Diagnosis Date   Abnormal Pap smear    ASCUS/LGSIL   Breast cancer ([facility]) 05/2020   left breast   Chronic kidney disease    horse shoe shaped kidneys   Gilbert's syndrome    pt denies problems   History of kidney stones    Infertility    Seen by RE @ [facility]   PONV (postoperative nausea and vomiting)    Vaginitis, atrophic     Past Surgical History:  Procedure Laterality Date   BREAST LUMPECTOMY WITH RADIOACTIVE SEED AND SENTINEL LYMPH NODE BIOPSY Left 05/29/2020   Procedure: LEFT BREAST LUMPECTOMY WITH RADIOACTIVE SEED AND LEFT SENTINEL LYMPH NODE BIOPSY;  Surgeon: Betty Luna, MD;  Location: [facility];  Service: General;  Laterality: Left;  P   CESAREAN SECTION  12/17/1993   [name]   DIAGNOSTIC LAPAROSCOPY     HYSTEROSCOPY WITH D & C N/A 03/09/2014   Procedure: DILATATION AND CURETTAGE /HYSTEROSCOPY;  Surgeon: Betty Filbert, MD;  Location: [facility];  Service: Gynecology;  Laterality: N/A;   kidney stone removal  2010   monalisa      There were no vitals filed for this visit.   Subjective Assessment - 01/16/21 1403     Subjective I have pain under the left breast.  I am on the next size of the dilator.    Pertinent History Patient  was diagnosed on 04/19/2020 with left grade II invasive ductal carcinoma breast cancer. She had left lumpectomy and sentinel node biopsy (3 negative nodes) on 05/29/2020. It is ER/PR positive and HER2 negative with a Ki67 of 10%.    Patient Stated Goals See how my arm is doing; reduce pelvic pain with intercourse    Currently in Pain? Yes    Pain Score 8     Pain Location Vagina    Pain Orientation Mid    Pain Descriptors / Indicators Tightness    Pain Type Chronic pain    Pain Onset More than a month ago    Pain Frequency Intermittent    Aggravating Factors  during vaginal exam, penile penetration vaginally    Pain Relieving Factors no vaginal penetration    Multiple Pain Sites No                [facility] PT Assessment - 01/16/21 0001       Observation/Other Assessments   Skin Integrity bruised area under the left breast where her bra line is. Took a picture and placed into chart under media and consulted [name]  [place] Adult PT Treatment/Exercise - 01/16/21 0001       Manual Therapy   Manual Therapy Soft tissue mobilization;Myofascial release    Soft tissue mobilization manual work to bilateral hip adductors and quadrucep and along the pubic ram                       PT Short Term Goals - 01/16/21 1448       PT SHORT TERM GOAL #1   Title Independent with self perineal massage and hip stretches    Time 4    Period Weeks    Status Achieved      PT SHORT TERM GOAL #2   Title educated on vaginal health, moisturizers, lubricants,    Time 4    Period Weeks    Status Achieved      PT SHORT TERM GOAL #3   Title able to bulge the pelvic floor due to abiity to perfrom diaphragmatic breathing to move the diaphragm    Time 4    Period Weeks    Status On-going               PT Long Term Goals - 12/24/20 1159       PT LONG TERM GOAL #2   Title Independent with advanced HEP and understand how to continue with  her dilators to keep the vaginal walls expanded    Time 4    Period Months    Status New    Target Date 04/26/21      PT LONG TERM GOAL #3   Title able to use the largest dilator and no pain greater than 3/10 so she is able to vaginal penetration with minimal to no pain    Time 4    Period Months    Status New    Target Date 04/26/21      PT LONG TERM GOAL #4   Title able to have penile penetration vaginally with her husband using appropriate lubricant with pain level </= 1/10    Time 4    Period Months    Target Date 04/26/21      PT LONG TERM GOAL #5   Title full lumbar ROM so her pelvis is able to move with greater ease and lengthen the pelvic floor muscles    Time 4    Period Months    Status New    Target Date 04/26/21                   Plan - 01/16/21 1445     Clinical Impression Statement Patient is now using the second dilator. She has some fascial restrictions in the urogenital diaphram and transverse pernieum. Patient has a discomfort in the lower left rib cage where the bra line is and loodk discolored. Took a picture and put in media. Suggested to patient to no wear underwire bra. Patient will benefit from skilled therapy to improve lengthening of the pelvic floor to reduce pain with vaginal penetration.    Personal Factors and Comorbidities Comorbidity 1    Comorbidities Left breast cancer; Left breast lumpectomy with radioactive seed and sentinal lmph node biopsy; on Arimidex; c-section 12/17/1993    Examination-Participation Restrictions Interpersonal Relationship    Stability/Clinical Decision Making Stable/Uncomplicated    Rehab Potential Excellent    PT Frequency 1x / week    PT Duration Other (comment)   4 months   PT Treatment/Interventions Biofeedback;Functional mobility training;Therapeutic activities;Therapeutic exercise;Neuromuscular re-education;Patient/family education;Manual  techniques;Passive range of motion;Dry needling;Spinal  Manipulations;Joint Manipulations    PT Next Visit Plan manual work on the thighs, gluteal; instruction on dilators; manual work on perineum; work on bulging the pelvic floor    PT Home Exercise Plan Post op shoulder ROM HEP;Access Code: [email or web address]    Consulted and Agree with Plan of Care Patient             Patient will benefit from skilled therapeutic intervention in order to improve the following deficits and impairments:  Decreased coordination, Decreased range of motion, Increased fascial restricitons, Pain, Decreased strength, Decreased activity tolerance, Decreased endurance  Visit Diagnosis: Other lack of coordination  Cramp and spasm  Malignant neoplasm of lower-outer quadrant of left breast of female, estrogen receptor positive [facility])  Aftercare following surgery for neoplasm     Problem List Patient Active Problem List   Diagnosis Date Noted   Malignant neoplasm of lower-outer quadrant of left breast of female, estrogen receptor positive ([place]) 05/04/2020   Postmenopausal bleeding 11/15/2013   Hx of abnormal cervical Pap smear 09/17/2010   Endometrial hyperplasia, simple 09/17/2010    Earlie Counts, PT 01/16/21 2:49 PM   [facility] @ [place], [place], [address] Phone: [phone]   Fax:  [phone]  Name: [name] MRN: [id number] Date of Birth: 06/15/1960

## 2021-01-16 NOTE — Telephone Encounter (Signed)
Spoke with patient as she had concerns about her rib pain and the darkness present on her skin in that area. It sounds like the skin darkening may be a result of radiation. She reports rib pain only occurs when she reaches up which is likely indicative of a musculoskeletal problem. She denies pain at rest or at night. I offered to see her this week or next week but she said she will see how she feels and see [place] on 01/28/2021 at her regular appointment. I also encouraged her to contact her doctor if she is concerned about her pain. Annia Friendly, [place] 01/16/21 3:49 PM

## 2021-01-16 NOTE — Telephone Encounter (Signed)
Scheduled per sch msg. Called and spoke with patient. Confirmed appt  

## 2021-01-23 ENCOUNTER — Encounter: Payer: Self-pay | Admitting: Physical Therapy

## 2021-01-28 ENCOUNTER — Other Ambulatory Visit: Payer: Self-pay

## 2021-01-28 ENCOUNTER — Ambulatory Visit: Payer: Self-pay | Admitting: Physical Therapy

## 2021-01-28 ENCOUNTER — Ambulatory Visit: Payer: BC Managed Care – PPO | Admitting: Physical Therapy

## 2021-01-28 ENCOUNTER — Encounter: Payer: Self-pay | Admitting: Physical Therapy

## 2021-01-28 DIAGNOSIS — R278 Other lack of coordination: Secondary | ICD-10-CM

## 2021-01-28 DIAGNOSIS — R252 Cramp and spasm: Secondary | ICD-10-CM

## 2021-01-28 NOTE — Therapy (Signed)
[facility] @ [place], [place], [address] Phone: [phone]   Fax:  [phone]  Physical Therapy Treatment  Patient Details  Name: [name] MRN: [id number] Date of Birth: 1960/07/13 Referring Provider (PT): Dr. Sherlene Shams   Encounter Date: 01/28/2021   PT End of Session - 01/28/21 1226     Visit Number 7   5 pelvic, 2 breast   Number of Visits 2    Date for PT Re-Evaluation 04/06/21   breast 5/22   Authorization Type BCBS    PT Start Time 2297    PT Stop Time 1225    PT Time Calculation (min) 40 min    Activity Tolerance Patient tolerated treatment well;No increased pain    Behavior During Therapy [facility] for tasks assessed/performed             Past Medical History:  Diagnosis Date   Abnormal Pap smear    ASCUS/LGSIL   Breast cancer ([facility]) 05/2020   left breast   Chronic kidney disease    horse shoe shaped kidneys   Gilbert's syndrome    pt denies problems   History of kidney stones    Infertility    Seen by RE @ [facility]   PONV (postoperative nausea and vomiting)    Vaginitis, atrophic     Past Surgical History:  Procedure Laterality Date   BREAST LUMPECTOMY WITH RADIOACTIVE SEED AND SENTINEL LYMPH NODE BIOPSY Left 05/29/2020   Procedure: LEFT BREAST LUMPECTOMY WITH RADIOACTIVE SEED AND LEFT SENTINEL LYMPH NODE BIOPSY;  Surgeon: [name], MD;  Location: [facility];  Service: General;  Laterality: Left;  P   CESAREAN SECTION  12/17/1993   [name]   DIAGNOSTIC LAPAROSCOPY     HYSTEROSCOPY WITH D & C N/A 03/09/2014   Procedure: DILATATION AND CURETTAGE /HYSTEROSCOPY;  Surgeon: [name], MD;  Location: [facility];  Service: Gynecology;  Laterality: N/A;   kidney stone removal  2010   monalisa      There were no vitals filed for this visit.   Subjective Assessment - 01/28/21 1142     Subjective I am doing good. I went up another size. I am not having pain with dilator. I am  scared of the next size.    Pertinent History Patient was diagnosed on 04/19/2020 with left grade II invasive ductal carcinoma breast cancer. She had left lumpectomy and sentinel node biopsy (3 negative nodes) on 05/29/2020. It is ER/PR positive and HER2 negative with a Ki67 of 10%.    Patient Stated Goals See how my arm is doing; reduce pelvic pain with intercourse    Currently in Pain? No/denies                            Pelvic Floor Special Questions - 01/28/21 0001     Pelvic Floor Internal Exam Patient confirms identification and approves PT to assess pelvic floor and treatment    Exam Type Vaginal               [facility] Adult PT Treatment/Exercise - 01/28/21 0001       Self-Care   Self-Care Other Self-Care Comments    Other Self-Care Comments  dicussed with patient on uding vaginal mositurizers like coconut oil daily due to lack of estrogen in her body; discussed with patient still using the dilator 1x per week then 1 time per month once she has  reached the largest dilator      Lumbar Exercises: Stretches   Piriformis Stretch Right;Left;1 rep;30 seconds    Piriformis Stretch Limitations supine with assistance    Other Lumbar Stretch Exercise happy baby stretch holding for 30 seconds      Lumbar Exercises: Aerobic   Stationary Bike 6 minutes while assessing patient on level 3      Manual Therapy   Manual Therapy Soft tissue mobilization;Internal Pelvic Floor    Soft tissue mobilization manual work to the urogenital diaphragm and transverse perineum    Internal Pelvic Floor manual work on the perineal body and posterior vaginal Youlanda Roys                     PT Education - 01/28/21 1223     Education Details education on vaginal dilator after she is done; education on vaginal moisturizers    Person(s) Educated Patient    Methods Explanation    Comprehension Verbalized understanding              PT Short Term Goals - 01/16/21 1448       PT  SHORT TERM GOAL #1   Title Independent with self perineal massage and hip stretches    Time 4    Period Weeks    Status Achieved      PT SHORT TERM GOAL #2   Title educated on vaginal health, moisturizers, lubricants,    Time 4    Period Weeks    Status Achieved      PT SHORT TERM GOAL #3   Title able to bulge the pelvic floor due to abiity to perfrom diaphragmatic breathing to move the diaphragm    Time 4    Period Weeks    Status On-going               PT Long Term Goals - 01/28/21 1230       PT LONG TERM GOAL #2   Title Independent with advanced HEP and understand how to continue with her dilators to keep the vaginal walls expanded    Time 4    Period Months    Status On-going      PT LONG TERM GOAL #3   Title able to use the largest dilator and no pain greater than 3/10 so she is able to vaginal penetration with minimal to no pain    Baseline on the second to last    Time 4    Period Months      PT LONG TERM GOAL #4   Title able to have penile penetration vaginally with her husband using appropriate lubricant with pain level </= 1/10    Baseline able to have intercourse one time    Period Months    Status On-going      PT LONG TERM GOAL #5   Title full lumbar ROM so her pelvis is able to move with greater ease and lengthen the pelvic floor muscles    Time 4    Period Months    Status On-going                   Plan - 01/28/21 1227     Clinical Impression Statement Patient has one more dilator left. She is not having pain with the last dilator. Patient was able to have penile penetration right after using the dilator and had minimal discomfort. Patient has one dilator left. She has some tightness in the perineal body  and along the transverse perineum. Patient will benefit from skilled therapy to improve lengtheing of the pelvic floor to reduce pain with vaginal penetration.    Personal Factors and Comorbidities Comorbidity 1    Comorbidities Left  breast cancer; Left breast lumpectomy with radioactive seed and sentinal lmph node biopsy; on Arimidex; c-section 12/17/1993    Examination-Participation Restrictions Interpersonal Relationship    Stability/Clinical Decision Making Stable/Uncomplicated    Rehab Potential Excellent    PT Frequency 1x / week    PT Duration Other (comment)   4 months   PT Treatment/Interventions Biofeedback;Functional mobility training;Therapeutic activities;Therapeutic exercise;Neuromuscular re-education;Patient/family education;Manual techniques;Passive range of motion;Dry needling;Spinal Manipulations;Joint Manipulations    PT Next Visit Plan if patient is not having issue with dilator then discharge; go over vaginal moisturizers, go over the progression of using the dilator    PT Home Exercise Plan Post op shoulder ROM HEP;Access Code: [email or web address]    WHKNZUDOD and Agree with Plan of Care Patient             Patient will benefit from skilled therapeutic intervention in order to improve the following deficits and impairments:  Decreased coordination, Decreased range of motion, Increased fascial restricitons, Pain, Decreased strength, Decreased activity tolerance, Decreased endurance  Visit Diagnosis: Other lack of coordination  Cramp and spasm     Problem List Patient Active Problem List   Diagnosis Date Noted   Malignant neoplasm of lower-outer quadrant of left breast of female, estrogen receptor positive ([place]) 05/04/2020   Postmenopausal bleeding 11/15/2013   Hx of abnormal cervical Pap smear 09/17/2010   Endometrial hyperplasia, simple 09/17/2010    Earlie Counts, PT 01/28/21 12:31 PM  [facility] @ [place], [place], [address] Phone: [phone]   Fax:  [phone]  Name: KENNESHIA REHM MRN: [id number] Date of Birth: Sep 16, 1960

## 2021-01-29 ENCOUNTER — Other Ambulatory Visit: Payer: Self-pay | Admitting: Genetic Counselor

## 2021-01-29 DIAGNOSIS — C50512 Malignant neoplasm of lower-outer quadrant of left female breast: Secondary | ICD-10-CM

## 2021-01-29 DIAGNOSIS — Z170 Estrogen receptor positive status [ER+]: Secondary | ICD-10-CM

## 2021-01-30 ENCOUNTER — Inpatient Hospital Stay: Payer: BC Managed Care – PPO | Attending: Hematology and Oncology | Admitting: Genetic Counselor

## 2021-01-30 ENCOUNTER — Inpatient Hospital Stay: Payer: BC Managed Care – PPO

## 2021-01-30 ENCOUNTER — Encounter: Payer: Self-pay | Admitting: Genetic Counselor

## 2021-01-30 ENCOUNTER — Other Ambulatory Visit: Payer: Self-pay

## 2021-01-30 ENCOUNTER — Inpatient Hospital Stay: Payer: BC Managed Care – PPO | Admitting: Dietician

## 2021-01-30 DIAGNOSIS — Z803 Family history of malignant neoplasm of breast: Secondary | ICD-10-CM

## 2021-01-30 DIAGNOSIS — C50512 Malignant neoplasm of lower-outer quadrant of left female breast: Secondary | ICD-10-CM

## 2021-01-30 DIAGNOSIS — Z800 Family history of malignant neoplasm of digestive organs: Secondary | ICD-10-CM

## 2021-01-30 DIAGNOSIS — Z170 Estrogen receptor positive status [ER+]: Secondary | ICD-10-CM

## 2021-01-30 DIAGNOSIS — Z8052 Family history of malignant neoplasm of bladder: Secondary | ICD-10-CM | POA: Diagnosis not present

## 2021-01-30 DIAGNOSIS — Z8041 Family history of malignant neoplasm of ovary: Secondary | ICD-10-CM

## 2021-01-30 LAB — GENETIC SCREENING ORDER

## 2021-01-30 NOTE — Progress Notes (Signed)
REFERRING PROVIDER: Nicholas Lose, MD [place],  [place] [address]  PRIMARY PROVIDER:  Wayland Salinas, MD  PRIMARY REASON FOR VISIT:  1. Family history of breast cancer   2. Family history of colon cancer   3. Family history of ovarian cancer   4. Family history of bladder cancer   5. Malignant neoplasm of lower-outer quadrant of left breast of female, estrogen receptor positive ([place])      HISTORY OF PRESENT ILLNESS:   [name], a 60 y.o. female, was seen for a [facility] cancer genetics consultation at the request of Dr. Lindi Adie due to a personal and family history of cancer.  [name] presents to clinic today to discuss the possibility of a hereditary predisposition to cancer, genetic testing, and to further clarify her future cancer risks, as well as potential cancer risks for family members.   In March 2022, at the age of 6, [name] was diagnosed with invasive ductal carcinoma of the left breast. The treatment plan included lumpectomy and radiation.    CANCER HISTORY:  Oncology History  Malignant neoplasm of lower-outer quadrant of left breast of female, estrogen receptor positive ([place])  05/04/2020 Initial Diagnosis   Screening mammogram showed a left breast asymmetry. Diagnostic mammogram and US showed a 0.3cm mass at the 6 o'clock position and no left axillary adenopathy. Biopsy showed invasive and in situ ductal carcinoma, grade 2, HER-2 equivocal by IHC, negative by FISH (ratio 1.27), ER+ 95%, PR+ 90%, Ki67 10%.   05/09/2020 Cancer Staging   Staging form: Breast, AJCC 8th Edition - Clinical stage from 05/09/2020: Stage IA (cT1a, cN0, cM0, G1, ER+, PR+, HER2-) - Signed by Nicholas Lose, MD on 05/09/2020 Stage prefix: Initial diagnosis    05/29/2020 Surgery   Left lumpectomy ([place]): IDC with DCIS, 0.9cm, grade 2, clear margins, 3 left axillary lymph nodes negative for carcinoma.   06/13/2020 Cancer Staging   Staging form: Breast, AJCC 8th  Edition - Pathologic: Stage IA (pT1b, pN0, cM0, G2, ER+, PR+, HER2-) - Signed by Gardenia Phlegm, NP on 06/13/2020 Histologic grading system: 3 grade system    06/13/2020 Oncotype testing   Oncotype score: 6: Distant risk of recurrence at 9 years: 3%   07/04/2020 - 08/01/2020 Radiation Therapy   Adjuvant radiation      RISK FACTORS:  Menarche was at age 55.  First live birth at age 79.  OCP use for approximately 3 years.  Ovaries intact: yes.  Hysterectomy: no.  Menopausal status: postmenopausal.  HRT use: 1 years. Colonoscopy: yes; normal. Mammogram within the last year: yes. Number of breast biopsies: 1. Up to date with pelvic exams: yes. Any excessive radiation exposure in the past: breast cancer treatment  Past Medical History:  Diagnosis Date   Abnormal Pap smear    ASCUS/LGSIL   Breast cancer ([place]) 05/2020   left breast   Chronic kidney disease    horse shoe shaped kidneys   Family history of bladder cancer    Family history of breast cancer    Family history of colon cancer    Family history of ovarian cancer    Gilbert's syndrome    pt denies problems   History of kidney stones    Infertility    Seen by RE @ [facility]   PONV (postoperative nausea and vomiting)    Vaginitis, atrophic     Past Surgical History:  Procedure Laterality Date   BREAST LUMPECTOMY WITH RADIOACTIVE SEED AND SENTINEL LYMPH NODE  BIOPSY Left 05/29/2020   Procedure: LEFT BREAST LUMPECTOMY WITH RADIOACTIVE SEED AND LEFT SENTINEL LYMPH NODE BIOPSY;  Surgeon: [name], MD;  Location: [facility];  Service: General;  Laterality: Left;  P   CESAREAN SECTION  12/17/1993   [name]   DIAGNOSTIC LAPAROSCOPY     HYSTEROSCOPY WITH D & C N/A 03/09/2014   Procedure: DILATATION AND CURETTAGE /HYSTEROSCOPY;  Surgeon: [name], MD;  Location: [facility];  Service: Gynecology;  Laterality: N/A;   kidney stone removal  2010   monalisa      Social History   Socioeconomic History   Marital  status: Married    Spouse name: Not on file   Number of children: Not on file   Years of education: Not on file   Highest education level: Not on file  Occupational History   Occupation: Product manager: [employer]  Tobacco Use   Smoking status: Never   Smokeless tobacco: Never  Vaping Use   Vaping Use: Never used  Substance and Sexual Activity   Alcohol use: No   Drug use: No   Sexual activity: Yes    Partners: Male    Birth control/protection: Post-menopausal  Other Topics Concern   Not on file  Social History Narrative   Not on file   Social Determinants of Health   Financial Resource Strain: Not on file  Food Insecurity: Not on file  Transportation Needs: Not on file  Physical Activity: Not on file  Stress: Not on file  Social Connections: Not on file     FAMILY HISTORY:  We obtained a detailed, 4-generation family history.  Significant diagnoses are listed below: Family History  Problem Relation Age of Onset   Hypertension Father    Diabetes Mother    Colon cancer Maternal Grandfather     The patient has a son and an adopted daughter who are cancer free.  She is an only child.  Her mother is living and her father is deceased.    The patient's mother has three sisters and a brother.  One sister died in infancy and one brother was diagnosed with colon cancer in his 44's.  The maternal grandfather had colon cancer in his 68's and the grandmother had a sister who had breast cancer.  The patient's father died of complications of parkinson disease and pneumonia at age 31.  He had four sisters who had cancer and a total of 13 siblings.  One sister had bladder cancer, another with ovarian cancer, a third with brain cancer and the last with lung cancer.  The paternal grandparents are deceased.    [name] is unaware of previous family history of genetic testing for hereditary cancer risks. Patient's maternal ancestors are of [place] descent, and  paternal ancestors are of [place] descent. There is no reported Ashkenazi Jewish ancestry. There is no known consanguinity.  GENETIC COUNSELING ASSESSMENT: [name] is a 60 y.o. female with a personal and family history of cancer which is somewhat suggestive of a hereditary cancer syndrome and predisposition to cancer given combination of cancer in the family. We, therefore, discussed and recommended the following at today's visit.   DISCUSSION: We discussed that, in general, most cancer is not inherited in families, but instead is sporadic or familial. Sporadic cancers occur by chance and typically happen at older ages (>50 years) as this type of cancer is caused by genetic changes acquired during an individual's lifetime. Some families have more  cancers than would be expected by chance; however, the ages or types of cancer are not consistent with a known genetic mutation or known genetic mutations have been ruled out. This type of familial cancer is thought to be due to a combination of multiple genetic, environmental, hormonal, and lifestyle factors. While this combination of factors likely increases the risk of cancer, the exact source of this risk is not currently identifiable or testable.  We discussed that 5 - 10% of breast cancer is hereditary, with most cases associated with BRCA mutations.  There are other genes that can be associated with hereditary breast cancer syndromes.  These include ATM, CHEK2 and PALB2.  We discussed that testing is beneficial for several reasons including knowing how to follow individuals after completing their treatment, identifying whether potential treatment options such as PARP inhibitors would be beneficial, and understand if other family members could be at risk for cancer and allow them to undergo genetic testing.   We reviewed the characteristics, features and inheritance patterns of hereditary cancer syndromes. We also discussed genetic testing, including the  appropriate family members to test, the process of testing, insurance coverage and turn-around-time for results. We discussed the implications of a negative, positive, carrier and/or variant of uncertain significant result. We recommended [name] pursue genetic testing for the CancerNext-Expanded+RNAinsight gene panel.   The CancerNext-Expanded gene panel offered by [facility] and includes sequencing and rearrangement analysis for the following 77 genes: AIP, ALK, APC*, ATM*, AXIN2, BAP1, BARD1, BLM, BMPR1A, BRCA1*, BRCA2*, BRIP1*, CDC73, CDH1*, CDK4, CDKN1B, CDKN2A, CHEK2*, CTNNA1, DICER1, FANCC, FH, FLCN, GALNT12, KIF1B, LZTR1, MAX, MEN1, MET, MLH1*, MSH2*, MSH3, MSH6*, MUTYH*, NBN, NF1*, NF2, NTHL1, PALB2*, PHOX2B, PMS2*, POT1, PRKAR1A, PTCH1, PTEN*, RAD51C*, RAD51D*, RB1, RECQL, RET, SDHA, SDHAF2, SDHB, SDHC, SDHD, SMAD4, SMARCA4, SMARCB1, SMARCE1, STK11, SUFU, TMEM127, TP53*, TSC1, TSC2, VHL and XRCC2 (sequencing and deletion/duplication); EGFR, EGLN1, HOXB13, KIT, MITF, PDGFRA, POLD1, and POLE (sequencing only); EPCAM and GREM1 (deletion/duplication only). DNA and RNA analyses performed for * genes.   Based on [name]'s personal and family history of cancer, she meets medical criteria for genetic testing. Despite that she meets criteria, she may still have an out of pocket cost. We discussed that if her out of pocket cost for testing is over $100, the laboratory will call and confirm whether she wants to proceed with testing.  If the out of pocket cost of testing is less than $100 she will be billed by the genetic testing laboratory.   PLAN: After considering the risks, benefits, and limitations, [name] provided informed consent to pursue genetic testing and the blood sample was sent to [facility] for analysis of the CancerNext-Expanded+RNAinsight. Results should be available within approximately 2-3 weeks' time, at which point they will be disclosed by telephone to [name],  as will any additional recommendations warranted by these results. [name] will receive a summary of her genetic counseling visit and a copy of her results once available. This information will also be available in Epic.   Lastly, we encouraged [name] to remain in contact with cancer genetics annually so that we can continuously update the family history and inform her of any changes in cancer genetics and testing that may be of benefit for this family.   [name] questions were answered to her satisfaction today. Our contact information was provided should additional questions or concerns arise. Thank you for the referral and allowing [place] to share in the care of your patient.  Kahlil Cowans P. Florene Glen, [place], [facility] Licensed, Insurance risk surveyor [email or web address] phone: [phone]  The patient was seen for a total of 40 minutes in face-to-face genetic counseling.  The patient was seen alone.  This patient was discussed with Drs. Magrinat, Lindi Adie and/or Burr Medico who agrees with the above.    _______________________________________________________________________ For Office Staff:  Number of people involved in session: 1 Was an Intern/ student involved with case: no

## 2021-02-07 ENCOUNTER — Encounter: Payer: Self-pay | Admitting: Obstetrics and Gynecology

## 2021-02-11 ENCOUNTER — Ambulatory Visit: Payer: BC Managed Care – PPO | Admitting: Obstetrics and Gynecology

## 2021-02-11 ENCOUNTER — Encounter: Payer: Self-pay | Admitting: Physical Therapy

## 2021-02-11 ENCOUNTER — Other Ambulatory Visit: Payer: Self-pay

## 2021-02-11 ENCOUNTER — Ambulatory Visit: Payer: BC Managed Care – PPO | Attending: Family Medicine | Admitting: Physical Therapy

## 2021-02-11 DIAGNOSIS — R278 Other lack of coordination: Secondary | ICD-10-CM | POA: Insufficient documentation

## 2021-02-11 DIAGNOSIS — R252 Cramp and spasm: Secondary | ICD-10-CM | POA: Diagnosis present

## 2021-02-11 NOTE — Patient Instructions (Addendum)
Look into using a vibrator or clitoral stimulator to improve sensation and blood flow  Continue for 1 month using the larger dilator every other day then you can use 1 time per week for 3 months Then can do 1-2 times per month  Moisturizers They are used in the vagina to hydrate the mucous membrane that make up the vaginal canal. Designed to keep a more normal acid balance (ph) Once placed in the vagina, it will last between two to three days.  Use 2-3 times per week at bedtime  Ingredients to avoid is glycerin and fragrance, can increase chance of infection Should not be used just before sex due to causing irritation Most are gels administered either in a tampon-shaped applicator or as a vaginal suppository. They are non-hormonal.   Types of Moisturizers(internal use)  Vitamin E vaginal suppositories- Whole foods, Amazon Moist Again Coconut oil- can break down condoms Julva- (Do no use if on Tamoxifen) amazon Yes moisturizer- amazon NeuEve Silk , NeuEve Silver for menopausal or over 65 (if have severe vaginal atrophy or cancer treatments use NeuEve Silk for  1 month than move to [employer])- [employer], [place].com Olive and Bee intimate cream- [email or web address].com.au Mae vaginal moisturizer- Amazon Aloe    Creams to use externally on the Vulva area Albertson's (good for for cancer patients that had radiation to the area)- [place] or [employer].[email or web address].br V-magic cream - amazon Julva-amazon Vital "V [place] salve ( help moisturize and help with thinning vulvar area, does have [place] by Irwin Brakeman labial moisturizer (Amazon,  Coconut or olive oil aloe   Things to avoid in the vaginal area Do not use things to irritate the vulvar area No lotions just specialized creams for the vulva area- Neogyn, V-magic, No soaps; can use Aveeno or Calendula cleanser if needed. Must be gentle No deodorants No  douches Good to sleep without underwear to let the vaginal area to air out No scrubbing: spread the lips to let warm water rinse over labias and pat dry   [facility] [address], [place], [place] [address] Phone # [phone] Fax [phone]  [email or web address]

## 2021-02-11 NOTE — Therapy (Signed)
[facility] @ [place], [place], [address] Phone: [phone]   Fax:  [phone]  Physical Therapy Treatment  Patient Details  Name: [name] MRN: [id number] Date of Birth: [birth year] Referring Provider (PT): Dr. Sherlene Shams   Encounter Date: 02/11/2021   PT End of Session - 02/11/21 1222     Visit Number 8    Date for PT Re-Evaluation 04/06/21    Authorization Type BCBS    PT Start Time 5681    PT Stop Time 1215    PT Time Calculation (min) 30 min    Activity Tolerance Patient tolerated treatment well;No increased pain    Behavior During Therapy [facility] for tasks assessed/performed             Past Medical History:  Diagnosis Date   Abnormal Pap smear    ASCUS/LGSIL   Breast cancer ([place]) 05/2020   left breast   Chronic kidney disease    horse shoe shaped kidneys   Family history of bladder cancer    Family history of breast cancer    Family history of colon cancer    Family history of ovarian cancer    Gilbert's syndrome    pt denies problems   History of kidney stones    Infertility    Seen by RE @ [facility]   PONV (postoperative nausea and vomiting)    Vaginitis, atrophic     Past Surgical History:  Procedure Laterality Date   BREAST LUMPECTOMY WITH RADIOACTIVE SEED AND SENTINEL LYMPH NODE BIOPSY Left 05/29/2020   Procedure: LEFT BREAST LUMPECTOMY WITH RADIOACTIVE SEED AND LEFT SENTINEL LYMPH NODE BIOPSY;  Surgeon: [name], MD;  Location: [facility];  Service: General;  Laterality: Left;  P   CESAREAN SECTION  12/17/1993   [name]   DIAGNOSTIC LAPAROSCOPY     HYSTEROSCOPY WITH D & C N/A 03/09/2014   Procedure: DILATATION AND CURETTAGE /HYSTEROSCOPY;  Surgeon: [name], MD;  Location: [facility];  Service: Gynecology;  Laterality: N/A;   kidney stone removal  2010   monalisa      There were no vitals filed for this visit.   Subjective Assessment - 02/11/21 1151      Subjective I am not having issues with the left rib pain I am using the biggest dilator and not using the reveleum. Pain level is 2-3/10. Had intercourse with husband with no difficulty.    Patient is accompained by: Family member    Pertinent History Patient was diagnosed on 04/19/2020 with left grade II invasive ductal carcinoma breast cancer. She had left lumpectomy and sentinel node biopsy (3 negative nodes) on 05/29/2020. It is ER/PR positive and HER2 negative with a Ki67 of 10%.    Patient Stated Goals See how my arm is doing; reduce pelvic pain with intercourse    Currently in Pain? No/denies                [facility] PT Assessment - 02/11/21 0001       Assessment   Medical Diagnosis N94.10 Dyspareunia, female    Referring Provider (PT) Dr. Sherlene Shams    Onset Date/Surgical Date 05/29/20    Hand Dominance Right    Prior Therapy Baselines      Precautions   Precautions Other (comment)    Precaution Comments recent surgery and left arm lymphedema risk      Restrictions   Weight Bearing Restrictions No  [place] residence      Prior Function   Level of Independence Independent      Cognition   Overall Cognitive Status Within Functional Limits for tasks assessed      Posture/Postural Control   Posture/Postural Control No significant limitations      AROM   Lumbar Extension full    Lumbar - Right Side Bend ful    Lumbar - Left Side Bend full    Lumbar - Right Rotation full    Lumbar - Left Rotation full      PROM   Right Hip External Rotation  55    Left Hip External Rotation  55      Strength   Right Hip Extension 5/5    Right Hip ABduction 5/5    Left Hip Extension 5/5    Left Hip ABduction 5/5                        Pelvic Floor Special Questions - 02/11/21 0001     Number of Pregnancies 2   1 miscarriage   Number of C-Sections 1    Currently Sexually Active Yes    Is this Painful No     Marinoff Scale no problems    Exam Type Deferred               [facility] Adult PT Treatment/Exercise - 02/11/21 0001       Self-Care   Self-Care Other Self-Care Comments    Other Self-Care Comments  discusses with patient on progression of dilators to maintain the vaginal opening; educated patient about clitoral stimlators to improve her sensation and blood flow ; discussed with patient on using moisturiezers and gave her samples of desert harvest gele      Lumbar Exercises: Aerobic   Stationary Bike 6 minutes while assessing patient on level 3                       PT Short Term Goals - 02/11/21 1226       PT SHORT TERM GOAL #1   Title Independent with self perineal massage and hip stretches    Time 4    Period Weeks    Status Achieved      PT SHORT TERM GOAL #2   Title educated on vaginal health, moisturizers, lubricants,    Time 4    Period Weeks    Status Achieved      PT SHORT TERM GOAL #3   Title able to bulge the pelvic floor due to abiity to perfrom diaphragmatic breathing to move the diaphragm    Time 4    Period Weeks    Status Achieved               PT Long Term Goals - 02/11/21 1226       PT LONG TERM GOAL #2   Title Independent with advanced HEP and understand how to continue with her dilators to keep the vaginal walls expanded    Time 4    Period Months    Status Achieved      PT LONG TERM GOAL #3   Title able to use the largest dilator and no pain greater than 3/10 so she is able to vaginal penetration with minimal to no pain    Time 4    Period Months    Status Achieved      PT  LONG TERM GOAL #4   Title able to have penile penetration vaginally with her husband using appropriate lubricant with pain level </= 1/10    Time 4    Period Months    Status Achieved      PT LONG TERM GOAL #5   Title full lumbar ROM so her pelvis is able to move with greater ease and lengthen the pelvic floor muscles    Time 4    Period Months     Status Achieved                   Plan - 02/11/21 1210     Clinical Impression Statement Patient is using the largest dilator with pain level 2-3 and will use the [facility] reveleum with it. She has had penile penetration with no pain for first time. Patient is using her dilators every other day and was educated on progression when no discomfort with the largest dilator. Patient has a home program that she is doing well. Patient was educated on using a clitoral simulator to improve blood flow and sensation around the clitorus and vaginal opening. Patient understands the importance of vaginal moisturizers to reduce vaginal dryness. She is ready for discharge.    Personal Factors and Comorbidities Comorbidity 1    Comorbidities Left breast cancer; Left breast lumpectomy with radioactive seed and sentinal lmph node biopsy; on Arimidex; c-section 12/17/1993    Examination-Participation Restrictions Interpersonal Relationship    Stability/Clinical Decision Making Stable/Uncomplicated    Rehab Potential Excellent    PT Treatment/Interventions Biofeedback;Functional mobility training;Therapeutic activities;Therapeutic exercise;Neuromuscular re-education;Patient/family education;Manual techniques;Passive range of motion;Dry needling;Spinal Manipulations;Joint Manipulations    PT Next Visit Plan Discharge to HEP    PT Home Exercise Plan Post op shoulder ROM HEP;Access Code: [email or web address]    Consulted and Agree with Plan of Care Patient             Patient will benefit from skilled therapeutic intervention in order to improve the following deficits and impairments:  Decreased coordination, Decreased range of motion, Increased fascial restricitons, Pain, Decreased strength, Decreased activity tolerance, Decreased endurance  Visit Diagnosis: Other lack of coordination  Cramp and spasm     Problem List Patient Active Problem List   Diagnosis Date Noted   Family history of breast  cancer 01/30/2021   Family history of colon cancer 01/30/2021   Family history of ovarian cancer 01/30/2021   Family history of bladder cancer 01/30/2021   Malignant neoplasm of lower-outer quadrant of left breast of female, estrogen receptor positive ([place]) 05/04/2020   Postmenopausal bleeding 11/15/2013   Hx of abnormal cervical Pap smear 09/17/2010   Endometrial hyperplasia, simple 09/17/2010    Earlie Counts, PT 02/11/21 12:28 PM  [facility] @ [place], [place], [address] Phone: [phone]   Fax:  [phone]  Name: ZELPHIA GLOVER MRN: [id number] Date of Birth: 1961-02-17  PHYSICAL THERAPY DISCHARGE SUMMARY  Visits from Start of Care: 8  Current functional level related to goals / functional outcomes: See above.    Remaining deficits: See above.    Education / Equipment: HEP   Patient agrees to discharge. Patient goals were met. Patient is being discharged due to meeting the stated rehab goals. Thank you for the referral. Earlie Counts, PT 02/11/21 12:28 PM

## 2021-02-18 ENCOUNTER — Encounter: Payer: BC Managed Care – PPO | Admitting: Physical Therapy

## 2021-02-20 ENCOUNTER — Ambulatory Visit: Payer: Self-pay | Admitting: Genetic Counselor

## 2021-02-20 ENCOUNTER — Encounter: Payer: Self-pay | Admitting: Genetic Counselor

## 2021-02-20 ENCOUNTER — Telehealth: Payer: Self-pay | Admitting: Genetic Counselor

## 2021-02-20 DIAGNOSIS — Z1379 Encounter for other screening for genetic and chromosomal anomalies: Secondary | ICD-10-CM

## 2021-02-20 NOTE — Telephone Encounter (Signed)
Revealed negative genetic testing.  Discussed that we do not know why she has breast cancer or why there is cancer in the family. It could be due to a different gene that we are not testing, or maybe our current technology may not be able to pick something up.  It will be important for her to keep in contact with genetics to keep up with whether additional testing may be needed.  One VUS in PMS2. This will not change medical management.

## 2021-02-20 NOTE — Progress Notes (Signed)
HPI:  Ms. Knisley was previously seen in the [facility] due to a personal and family history of cancer and concerns regarding a hereditary predisposition to cancer. Please refer to our prior cancer genetics clinic note for more information regarding our discussion, assessment and recommendations, at the time. Ms. Kennerson recent genetic test results were disclosed to her, as were recommendations warranted by these results. These results and recommendations are discussed in more detail below.  CANCER HISTORY:  Oncology History  Malignant neoplasm of lower-outer quadrant of left breast of female, estrogen receptor positive ([place])  05/04/2020 Initial Diagnosis   Screening mammogram showed a left breast asymmetry. Diagnostic mammogram and US showed a 0.3cm mass at the 6 o'clock position and no left axillary adenopathy. Biopsy showed invasive and in situ ductal carcinoma, grade 2, HER-2 equivocal by IHC, negative by FISH (ratio 1.27), ER+ 95%, PR+ 90%, Ki67 10%.   05/09/2020 Cancer Staging   Staging form: Breast, AJCC 8th Edition - Clinical stage from 05/09/2020: Stage IA (cT1a, cN0, cM0, G1, ER+, PR+, HER2-) - Signed by Nicholas Lose, MD on 05/09/2020 Stage prefix: Initial diagnosis    05/29/2020 Surgery   Left lumpectomy ([place]): IDC with DCIS, 0.9cm, grade 2, clear margins, 3 left axillary lymph nodes negative for carcinoma.   06/13/2020 Cancer Staging   Staging form: Breast, AJCC 8th Edition - Pathologic: Stage IA (pT1b, pN0, cM0, G2, ER+, PR+, HER2-) - Signed by Gardenia Phlegm, NP on 06/13/2020 Histologic grading system: 3 grade system    06/13/2020 Oncotype testing   Oncotype score: 6: Distant risk of recurrence at 9 years: 3%   07/04/2020 - 08/01/2020 Radiation Therapy   Adjuvant radiation   02/20/2021 Genetic Testing   Negative genetic testing on the CancerNext-Expanded+RNAinsight panel.  PMS2 p.P404R VUS identified. The report date is 02/19/2021.  The  CancerNext-Expanded gene panel offered by [facility] and includes sequencing and rearrangement analysis for the following 77 genes: AIP, ALK, APC*, ATM*, AXIN2, BAP1, BARD1, BLM, BMPR1A, BRCA1*, BRCA2*, BRIP1*, CDC73, CDH1*, CDK4, CDKN1B, CDKN2A, CHEK2*, CTNNA1, DICER1, FANCC, FH, FLCN, GALNT12, KIF1B, LZTR1, MAX, MEN1, MET, MLH1*, MSH2*, MSH3, MSH6*, MUTYH*, NBN, NF1*, NF2, NTHL1, PALB2*, PHOX2B, PMS2*, POT1, PRKAR1A, PTCH1, PTEN*, RAD51C*, RAD51D*, RB1, RECQL, RET, SDHA, SDHAF2, SDHB, SDHC, SDHD, SMAD4, SMARCA4, SMARCB1, SMARCE1, STK11, SUFU, TMEM127, TP53*, TSC1, TSC2, VHL and XRCC2 (sequencing and deletion/duplication); EGFR, EGLN1, HOXB13, KIT, MITF, PDGFRA, POLD1, and POLE (sequencing only); EPCAM and GREM1 (deletion/duplication only). DNA and RNA analyses performed for * genes.     FAMILY HISTORY:  We obtained a detailed, 4-generation family history.  Significant diagnoses are listed below: Family History  Problem Relation Age of Onset   Diabetes Mother    Hypertension Father    Colon cancer Maternal Grandfather 43   Breast cancer Other        MGMs sister   Cancer Cousin 32       unknown cancer   Colon cancer Maternal Uncle 17   Ovarian cancer Paternal Aunt    Brain cancer Paternal Aunt    Bladder Cancer Paternal Aunt     The patient has a son and an adopted daughter who are cancer free.  She is an only child.  Her mother is living and her father is deceased.     The patient's mother has three sisters and a brother.  One sister died in infancy and one brother was diagnosed with colon cancer in his 57's.  The maternal grandfather had colon cancer in  his 73's and the grandmother had a sister who had breast cancer.   The patient's father died of complications of parkinson disease and pneumonia at age 47.  He had four sisters who had cancer and a total of 13 siblings.  One sister had bladder cancer, another with ovarian cancer, a third with brain cancer and the last with lung cancer.   The paternal grandparents are deceased.     Ms. Devries is unaware of previous family history of genetic testing for hereditary cancer risks. Patient's maternal ancestors are of [place] descent, and paternal ancestors are of [place] descent. There is no reported Ashkenazi Jewish ancestry. There is no known consanguinity.    GENETIC TEST RESULTS: Genetic testing reported out on February 20, 2021 through the CancerNext-Expanded+RNAinsight cancer panel found no pathogenic mutations. The CancerNext-Expanded gene panel offered by [facility] and includes sequencing and rearrangement analysis for the following 77 genes: AIP, ALK, APC*, ATM*, AXIN2, BAP1, BARD1, BLM, BMPR1A, BRCA1*, BRCA2*, BRIP1*, CDC73, CDH1*, CDK4, CDKN1B, CDKN2A, CHEK2*, CTNNA1, DICER1, FANCC, FH, FLCN, GALNT12, KIF1B, LZTR1, MAX, MEN1, MET, MLH1*, MSH2*, MSH3, MSH6*, MUTYH*, NBN, NF1*, NF2, NTHL1, PALB2*, PHOX2B, PMS2*, POT1, PRKAR1A, PTCH1, PTEN*, RAD51C*, RAD51D*, RB1, RECQL, RET, SDHA, SDHAF2, SDHB, SDHC, SDHD, SMAD4, SMARCA4, SMARCB1, SMARCE1, STK11, SUFU, TMEM127, TP53*, TSC1, TSC2, VHL and XRCC2 (sequencing and deletion/duplication); EGFR, EGLN1, HOXB13, KIT, MITF, PDGFRA, POLD1, and POLE (sequencing only); EPCAM and GREM1 (deletion/duplication only). DNA and RNA analyses performed for * genes. The test report has been scanned into EPIC and is located under the Molecular Pathology section of the Results Review tab.  A portion of the result report is included below for reference.     We discussed with Ms. Feld that because current genetic testing is not perfect, it is possible there may be a gene mutation in one of these genes that current testing cannot detect, but that chance is small.  We also discussed, that there could be another gene that has not yet been discovered, or that we have not yet tested, that is responsible for the cancer diagnoses in the family. It is also possible there is a hereditary cause for the cancer in the  family that Ms. Cafarella did not inherit and therefore was not identified in her testing.  Therefore, it is important to remain in touch with cancer genetics in the future so that we can continue to offer Ms. Laver the most up to date genetic testing.   Genetic testing did identify a variant of uncertain significance (VUS) was identified in the PMS2 gene called [id number].[id number].  At this time, it is unknown if this variant is associated with increased cancer risk or if this is a normal finding, but most variants such as this get reclassified to being inconsequential. It should not be used to make medical management decisions. With time, we suspect the lab will determine the significance of this variant, if any. If we do learn more about it, we will try to contact Ms. Michalowski to discuss it further. However, it is important to stay in touch with [place] periodically and keep the address and phone number up to date.  ADDITIONAL GENETIC TESTING: We discussed with Ms. Stelzer that her genetic testing was fairly extensive.  If there are genes identified to increase cancer risk that can be analyzed in the future, we would be happy to discuss and coordinate this testing at that time.    CANCER SCREENING RECOMMENDATIONS: Ms. Kantor test result is considered negative (normal).  This  means that we have not identified a hereditary cause for her personal and family history of cancer at this time. Most cancers happen by chance and this negative test suggests that her cancer may fall into this category.    While reassuring, this does not definitively rule out a hereditary predisposition to cancer. It is still possible that there could be genetic mutations that are undetectable by current technology. There could be genetic mutations in genes that have not been tested or identified to increase cancer risk.  Therefore, it is recommended she continue to follow the cancer management and screening guidelines provided by her oncology and primary  healthcare provider.   An individual's cancer risk and medical management are not determined by genetic test results alone. Overall cancer risk assessment incorporates additional factors, including personal medical history, family history, and any available genetic information that may result in a personalized plan for cancer prevention and surveillance  RECOMMENDATIONS FOR FAMILY MEMBERS:  Individuals in this family might be at some increased risk of developing cancer, over the general population risk, simply due to the family history of cancer.  We recommended women in this family have a yearly mammogram beginning at age 52, or 59 years younger than the earliest onset of cancer, an annual clinical breast exam, and perform monthly breast self-exams. Women in this family should also have a gynecological exam as recommended by their primary provider. All family members should be referred for colonoscopy starting at age 28.  FOLLOW-UP: Lastly, we discussed with Ms. Kimberley that cancer genetics is a rapidly advancing field and it is possible that new genetic tests will be appropriate for her and/or her family members in the future. We encouraged her to remain in contact with cancer genetics on an annual basis so we can update her personal and family histories and let her know of advances in cancer genetics that may benefit this family.   Our contact number was provided. Ms. Yohn questions were answered to her satisfaction, and she knows she is welcome to call us at anytime with additional questions or concerns.   Roma Kayser, [place], [facility] Licensed, Certified Genetic Counselor [email or web address]

## 2021-02-27 ENCOUNTER — Encounter: Payer: Self-pay | Admitting: Physical Therapy

## 2021-02-27 ENCOUNTER — Encounter: Payer: BC Managed Care – PPO | Admitting: Physical Therapy

## 2021-03-06 ENCOUNTER — Ambulatory Visit: Payer: BC Managed Care – PPO | Admitting: Physical Therapy

## 2021-03-13 ENCOUNTER — Encounter: Payer: BC Managed Care – PPO | Admitting: Physical Therapy

## 2021-03-20 ENCOUNTER — Ambulatory Visit: Payer: BC Managed Care – PPO | Admitting: Obstetrics and Gynecology

## 2021-03-25 ENCOUNTER — Other Ambulatory Visit: Payer: Self-pay

## 2021-03-25 ENCOUNTER — Ambulatory Visit: Payer: BC Managed Care – PPO | Attending: Family Medicine

## 2021-03-25 VITALS — Wt 134.1 lb

## 2021-03-25 DIAGNOSIS — Z483 Aftercare following surgery for neoplasm: Secondary | ICD-10-CM | POA: Insufficient documentation

## 2021-03-25 NOTE — Therapy (Signed)
[facility] @ [address] [place], [place], [address] Phone: [phone]   Fax:  [phone]  Physical Therapy Treatment  Patient Details  Name: [name] MRN: [id number] Date of Birth: [birth year] Referring Provider (PT): Dr. Sherlene Shams   Encounter Date: 03/25/2021   PT End of Session - 03/25/21 1529     Visit Number 8   # unchnaged due to screen only   PT Start Time 6812    PT Stop Time 1429    PT Time Calculation (min) 4 min    Activity Tolerance Patient tolerated treatment well    Behavior During Therapy [facility] for tasks assessed/performed             Past Medical History:  Diagnosis Date   Abnormal Pap smear    ASCUS/LGSIL   Breast cancer ([place]) 05/2020   left breast   Chronic kidney disease    horse shoe shaped kidneys   Family history of bladder cancer    Family history of breast cancer    Family history of colon cancer    Family history of ovarian cancer    Gilbert's syndrome    pt denies problems   History of kidney stones    Infertility    Seen by RE @ [facility]   PONV (postoperative nausea and vomiting)    Vaginitis, atrophic     Past Surgical History:  Procedure Laterality Date   BREAST LUMPECTOMY WITH RADIOACTIVE SEED AND SENTINEL LYMPH NODE BIOPSY Left 05/29/2020   Procedure: LEFT BREAST LUMPECTOMY WITH RADIOACTIVE SEED AND LEFT SENTINEL LYMPH NODE BIOPSY;  Surgeon: [name], MD;  Location: [facility];  Service: General;  Laterality: Left;  P   CESAREAN SECTION  12/17/1993   [name]   DIAGNOSTIC LAPAROSCOPY     HYSTEROSCOPY WITH D & C N/A 03/09/2014   Procedure: DILATATION AND CURETTAGE /HYSTEROSCOPY;  Surgeon: [name], MD;  Location: [facility];  Service: Gynecology;  Laterality: N/A;   kidney stone removal  2010   [facility]      Vitals:   03/25/21 1527  Weight: 134 lb 2 oz (60.8 kg)     Subjective Assessment - 03/25/21 1526     Subjective Pt return for her 3 month L-Dex  screen.    Pertinent History Patient was diagnosed on 04/19/2020 with left grade II invasive ductal carcinoma breast cancer. She had left lumpectomy and sentinel node biopsy (3 negative nodes) on 05/29/2020. It is ER/PR positive and HER2 negative with a Ki67 of 10%.                    L-DEX FLOWSHEETS - 03/25/21 1500       L-DEX LYMPHEDEMA SCREENING   Measurement Type Unilateral    L-DEX MEASUREMENT EXTREMITY Upper Extremity    POSITION  Standing    DOMINANT SIDE Right    At Risk Side Left    BASELINE SCORE (UNILATERAL) -4.1    L-DEX SCORE (UNILATERAL) -2.1    VALUE CHANGE (UNILAT) 2                                  PT Short Term Goals - 02/11/21 1226       PT SHORT TERM GOAL #1   Title Independent with self perineal massage and hip stretches    Time 4    Period Weeks    Status  Achieved      PT SHORT TERM GOAL #2   Title educated on vaginal health, moisturizers, lubricants,    Time 4    Period Weeks    Status Achieved      PT SHORT TERM GOAL #3   Title able to bulge the pelvic floor due to abiity to perfrom diaphragmatic breathing to move the diaphragm    Time 4    Period Weeks    Status Achieved               PT Long Term Goals - 02/11/21 1226       PT LONG TERM GOAL #2   Title Independent with advanced HEP and understand how to continue with her dilators to keep the vaginal walls expanded    Time 4    Period Months    Status Achieved      PT LONG TERM GOAL #3   Title able to use the largest dilator and no pain greater than 3/10 so she is able to vaginal penetration with minimal to no pain    Time 4    Period Months    Status Achieved      PT LONG TERM GOAL #4   Title able to have penile penetration vaginally with her husband using appropriate lubricant with pain level </= 1/10    Time 4    Period Months    Status Achieved      PT LONG TERM GOAL #5   Title full lumbar ROM so her pelvis is able to move with greater  ease and lengthen the pelvic floor muscles    Time 4    Period Months    Status Achieved                   Plan - 03/25/21 1529     Clinical Impression Statement Pt returns for her 3 month L-Dex screen. Her change from baseline of 2 is WNLs so no further treatment is required at this time except to cont every 3 month L-Dex screens which pt is agreeable to.    PT Next Visit Plan Cont every 3 month L-Dex screens for up to 2 years from her SLNB (~05/30/2022)    Consulted and Agree with Plan of Care Patient             Patient will benefit from skilled therapeutic intervention in order to improve the following deficits and impairments:     Visit Diagnosis: Aftercare following surgery for neoplasm     Problem List Patient Active Problem List   Diagnosis Date Noted   Genetic testing 02/20/2021   Family history of breast cancer 01/30/2021   Family history of colon cancer 01/30/2021   Family history of ovarian cancer 01/30/2021   Family history of bladder cancer 01/30/2021   Malignant neoplasm of lower-outer quadrant of left breast of female, estrogen receptor positive ([place]) 05/04/2020   Postmenopausal bleeding 11/15/2013   Hx of abnormal cervical Pap smear 09/17/2010   Endometrial hyperplasia, simple 09/17/2010    Otelia Limes, PTA 03/25/2021, 3:30 PM  [facility] @ [address] [place], [place], [address] Phone: [phone]   Fax:  [phone]  Name: [name] MRN: [id number] Date of Birth: September 08, 1960

## 2021-03-27 ENCOUNTER — Encounter: Payer: Self-pay | Admitting: Physical Therapy

## 2021-04-02 NOTE — Progress Notes (Signed)
GYNECOLOGY ANNUAL PREVENTATIVE CARE ENCOUNTER NOTE  History:     [name] is a 61 y.o. G34P1011 female here for a routine annual gynecologic exam.    Current complaints: vaginal dryness.  She has done PFPT and vaginal lubricants.     Denies abnormal vaginal bleeding, discharge, pelvic pain, problems with intercourse or other gynecologic concerns.     Gynecologic History No LMP recorded. Patient is postmenopausal.  Last Pap: 03/2020. ASCUS/HPV neg Last Mammogram: Due in 2023 -- 2022 was diagnosed with left breast cancer Last Colonoscopy: 2014.  Result was normal  Obstetric History OB History  Gravida Para Term Preterm AB Living  2 1 1   1 1   SAB IAB Ectopic Multiple Live Births  1       1    # Outcome Date GA Lbr Len/2nd Weight Sex Delivery Anes PTL Lv  2 Term 12/17/93    M CS-Unspec EPI N LIV  1 SAB             Past Medical History:  Diagnosis Date   Abnormal Pap smear    ASCUS/LGSIL   Breast cancer ([name]) 05/2020   left breast   Chronic kidney disease    horse shoe shaped kidneys   Family history of bladder cancer    Family history of breast cancer    Family history of colon cancer    Family history of ovarian cancer    Gilbert's syndrome    pt denies problems   History of kidney stones    Infertility    Seen by RE @ [name]   PONV (postoperative nausea and vomiting)    Vaginitis, atrophic     Past Surgical History:  Procedure Laterality Date   BREAST LUMPECTOMY WITH RADIOACTIVE SEED AND SENTINEL LYMPH NODE BIOPSY Left 05/29/2020   Procedure: LEFT BREAST LUMPECTOMY WITH RADIOACTIVE SEED AND LEFT SENTINEL LYMPH NODE BIOPSY;  Surgeon: Betty Luna, MD;  Location: [name];  Service: General;  Laterality: Left;  P   CESAREAN SECTION  12/17/1993   [name]   DIAGNOSTIC LAPAROSCOPY     HYSTEROSCOPY WITH D & C N/A 03/09/2014   Procedure: DILATATION AND CURETTAGE /HYSTEROSCOPY;  Surgeon: Betty Filbert, MD;  Location: [name];  Service: Gynecology;   Laterality: N/A;   kidney stone removal  2010   [facility]      Current Outpatient Medications on File Prior to Visit  Medication Sig Dispense Refill   anastrozole (ARIMIDEX) 1 MG tablet Take 1 tablet (1 mg total) by mouth daily. 90 tablet 3   Ascorbic Acid (VITAMIN C) 1000 MG tablet Take 1,000 mg by mouth daily.     calcium carbonate (OS-CAL) 600 MG TABS tablet      Cholecalciferol (VITAMIN D3) 50 MCG (2000 UT) TABS Take 4,000 Units by mouth in the morning.     Menatetrenone (VITAMIN K2) 100 MCG TABS      tretinoin (RETIN-A) 0.05 % cream Apply 1 application topically at bedtime.     XIIDRA 5 % SOLN Place 1 drop into both eyes in the morning.     Zinc 50 MG TABS Take 50 mg by mouth in the morning.     No current facility-administered medications on file prior to visit.    No Known Allergies  Social History:  reports that she has never smoked. She has never used smokeless tobacco. She reports that she does not drink alcohol and does not use drugs.  Family History  Problem  Relation Age of Onset   Diabetes Mother    Hypertension Father    Colon cancer Maternal Grandfather 44   Breast cancer Other        MGMs sister   Cancer Cousin 63       unknown cancer   Colon cancer Maternal Uncle 27   Ovarian cancer Paternal Aunt    Brain cancer Paternal Aunt    Bladder Cancer Paternal Aunt     The following portions of the patient's history were reviewed and updated as appropriate: allergies, current medications, past family history, past medical history, past social history, past surgical history and problem list.  Review of Systems Pertinent items noted in HPI and remainder of comprehensive ROS otherwise negative.  Physical Exam:  There were no vitals taken for this visit. CONSTITUTIONAL: Well-developed, well-nourished female in no acute distress.  HENT:  Normocephalic, atraumatic, External right and left ear normal.  EYES: Conjunctivae and EOM are normal. Pupils are equal, round, and  reactive to light. No scleral icterus.  NECK: Normal range of motion, supple, no masses.  Normal thyroid.  SKIN: Skin is warm and dry. No rash noted. Not diaphoretic. No erythema. No pallor. MUSCULOSKELETAL: Normal range of motion. No tenderness.  No cyanosis, clubbing, or edema. NEUROLOGIC: Alert and oriented to person, place, and time. Normal reflexes, muscle tone coordination.  PSYCHIATRIC: Normal mood and affect. Normal behavior. Normal judgment and thought content.  CARDIOVASCULAR: Normal heart rate noted, regular rhythm RESPIRATORY: Clear to auscultation bilaterally. Effort and breath sounds normal, no problems with respiration noted.  BREASTS: Symmetric in size. No masses, tenderness, skin changes, nipple drainage, or lymphadenopathy bilaterally. Lumpectomy scar noted on the left. Performed in the presence of a chaperone. ABDOMEN: Soft, no distention noted.  No tenderness, rebound or guarding.  PELVIC: External genitalia normal, Vagina normal without discharge, Urethra without abnormality or discharge, no bladder tenderness, cervix normal in appearance, no CMT, uterus normal size, shape, and consistency, no adnexal masses or tenderness. Performed in the presence of a chaperone.  Assessment and Plan:  Diagnoses and all orders for this visit:  Encounter for annual routine gynecological examination  - Cervical cancer screening: Discussed guidelines. Pap with HPV done  - Breast Health: Encouraged self breast awareness/SBE. Discussed limits of clinical breast exam for detecting breast cancer. Discussed importance of annual MXR.  Rx given - Climacteric/Sexual health: Reviewed typical and atypical symptoms of menopause/peri-menopause. Discussed PMB and to call if any amount of spotting.  - Bone Health: Calcium via diet and supplementation. Discussed weight bearing exercise. DEXA due at age 25-65 - Colonoscopy:  2014 - was given until 2024. - F/U 12 months and prn  Reviewed OTC measures for  vaginal atrophy.  Routine preventative health maintenance measures emphasized. Please refer to After Visit Summary for other counseling recommendations.   Betty Gunning, MD, [place] for [facility], [place]

## 2021-04-04 ENCOUNTER — Other Ambulatory Visit: Payer: Self-pay

## 2021-04-04 ENCOUNTER — Ambulatory Visit (INDEPENDENT_AMBULATORY_CARE_PROVIDER_SITE_OTHER): Payer: BC Managed Care – PPO | Admitting: Obstetrics and Gynecology

## 2021-04-04 ENCOUNTER — Encounter: Payer: Self-pay | Admitting: Obstetrics and Gynecology

## 2021-04-04 ENCOUNTER — Other Ambulatory Visit (HOSPITAL_COMMUNITY)
Admission: RE | Admit: 2021-04-04 | Discharge: 2021-04-04 | Disposition: A | Discharge status: Home / Self Care | Payer: BC Managed Care – PPO | Source: Ambulatory Visit | Attending: Obstetrics and Gynecology | Admitting: Obstetrics and Gynecology

## 2021-04-04 VITALS — BP 152/85 | HR 77 | Resp 16 | Ht 62.0 in | Wt 133.0 lb

## 2021-04-04 DIAGNOSIS — Z01419 Encounter for gynecological examination (general) (routine) without abnormal findings: Secondary | ICD-10-CM | POA: Insufficient documentation

## 2021-04-04 NOTE — Patient Instructions (Signed)
Evening Primrose Oil - nightly, orally Vaginal Moisturizer - Replens is the most common brand Vaginal lubricant - Silicone based or oil based. Coconut oil is more manageable. Johny Chess it in the fridge and the store in a drawer or cabinet and can use as needed.

## 2021-04-08 LAB — CYTOLOGY - PAP
Comment: NEGATIVE
Diagnosis: NEGATIVE
High risk HPV: NEGATIVE

## 2021-04-23 ENCOUNTER — Other Ambulatory Visit: Payer: Self-pay

## 2021-04-23 ENCOUNTER — Ambulatory Visit
Admission: RE | Admit: 2021-04-23 | Discharge: 2021-04-23 | Disposition: A | Discharge status: Home / Self Care | Payer: BC Managed Care – PPO | Source: Ambulatory Visit | Attending: Adult Health | Admitting: Adult Health

## 2021-04-23 DIAGNOSIS — Z170 Estrogen receptor positive status [ER+]: Secondary | ICD-10-CM

## 2021-04-23 DIAGNOSIS — C50512 Malignant neoplasm of lower-outer quadrant of left female breast: Secondary | ICD-10-CM

## 2021-04-30 NOTE — Progress Notes (Incomplete)
? ?Patient Care Team: ?Wayland Salinas, MD as PCP - General (Family Medicine) ?[name], MD as Consulting Physician (General Surgery) ?Nicholas Lose, MD as Consulting Physician (Hematology and Oncology) ?Eppie Gibson, MD as Attending Physician (Radiation Oncology) ? ?DIAGNOSIS: No diagnosis found. ? ?SUMMARY OF ONCOLOGIC HISTORY: ?Oncology History  ?Malignant neoplasm of lower-outer quadrant of left breast of female, estrogen receptor positive ([place])  ?05/04/2020 Initial Diagnosis  ? Screening mammogram showed a left breast asymmetry. Diagnostic mammogram and US showed a 0.3cm mass at the 6 o'clock position and no left axillary adenopathy. Biopsy showed invasive and in situ ductal carcinoma, grade 2, HER-2 equivocal by IHC, negative by FISH (ratio 1.27), ER+ 95%, PR+ 90%, Ki67 10%. ?  ?05/09/2020 Cancer Staging  ? Staging form: Breast, AJCC 8th Edition ?- Clinical stage from 05/09/2020: Stage IA (cT1a, cN0, cM0, G1, ER+, PR+, HER2-) - Signed by Nicholas Lose, MD on 05/09/2020 ?Stage prefix: Initial diagnosis ? ?  ?05/29/2020 Surgery  ? Left lumpectomy ([facility]): IDC with DCIS, 0.9cm, grade 2, clear margins, 3 left axillary lymph nodes negative for carcinoma. ?  ?06/13/2020 Cancer Staging  ? Staging form: Breast, AJCC 8th Edition ?- Pathologic: Stage IA (pT1b, pN0, cM0, G2, ER+, PR+, HER2-) - Signed by Gardenia Phlegm, NP on 06/13/2020 ?Histologic grading system: 3 grade system ? ?  ?06/13/2020 Oncotype testing  ? Oncotype score: 6: Distant risk of recurrence at 9 years: 3% ?  ?07/04/2020 - 08/01/2020 Radiation Therapy  ? Adjuvant radiation ?  ?02/20/2021 Genetic Testing  ? Negative genetic testing on the CancerNext-Expanded+RNAinsight panel.  PMS2 p.P404R VUS identified. The report date is 02/19/2021. ? ?The CancerNext-Expanded gene panel offered by [facility] and includes sequencing and rearrangement analysis for the following 77 genes: AIP, ALK, APC*, ATM*, AXIN2, BAP1, BARD1, BLM, BMPR1A, BRCA1*,  BRCA2*, BRIP1*, CDC73, CDH1*, CDK4, CDKN1B, CDKN2A, CHEK2*, CTNNA1, DICER1, FANCC, FH, FLCN, GALNT12, KIF1B, LZTR1, MAX, MEN1, MET, MLH1*, MSH2*, MSH3, MSH6*, MUTYH*, NBN, NF1*, NF2, NTHL1, PALB2*, PHOX2B, PMS2*, POT1, PRKAR1A, PTCH1, PTEN*, RAD51C*, RAD51D*, RB1, RECQL, RET, SDHA, SDHAF2, SDHB, SDHC, SDHD, SMAD4, SMARCA4, SMARCB1, SMARCE1, STK11, SUFU, TMEM127, TP53*, TSC1, TSC2, VHL and XRCC2 (sequencing and deletion/duplication); EGFR, EGLN1, HOXB13, KIT, MITF, PDGFRA, POLD1, and POLE (sequencing only); EPCAM and GREM1 (deletion/duplication only). DNA and RNA analyses performed for * genes. ?  ? ? ?CHIEF COMPLIANT: Follow-up of left breast cancer ? ?INTERVAL HISTORY: JAYLEAH GARBERS is a 61 y.o. with above-mentioned history of left breast cancer who underwent a left lumpectomy and radiation, currently on antiestrogen therapy with anastrozole. She presents to the clinic today for follow-up.  ? ?ALLERGIES:  has No Known Allergies. ? ?MEDICATIONS:  ?Current Outpatient Medications  ?Medication Sig Dispense Refill  ? anastrozole (ARIMIDEX) 1 MG tablet Take 1 tablet (1 mg total) by mouth daily. 90 tablet 3  ? calcium carbonate (OS-CAL) 600 MG TABS tablet 1,200 mg.    ? Menatetrenone (VITAMIN K2) 100 MCG TABS     ? tretinoin (RETIN-A) 0.05 % cream Apply 1 application topically at bedtime.    ? XIIDRA 5 % SOLN Place 1 drop into both eyes in the morning.    ? ?No current facility-administered medications for this visit.  ? ? ?PHYSICAL EXAMINATION: ?ECOG PERFORMANCE STATUS: {CHL ONC ECOG ON:[id number]} ? ?There were no vitals filed for this visit. ?There were no vitals filed for this visit. ? ?BREAST:*** No palpable masses or nodules in either right or left breasts. No palpable axillary supraclavicular or infraclavicular adenopathy no breast tenderness  or nipple discharge. (exam performed in the presence of a chaperone) ? ?LABORATORY DATA:  ?I have reviewed the data as listed ?CMP Latest Ref Rng & Units 05/23/2020 05/09/2020  03/13/2016  ?Glucose 70 - 99 mg/dL 85 95 69  ?BUN 6 - 20 mg/dL $Remove'17 18 20  'SCujEtM$ ?Creatinine 0.44 - 1.00 mg/dL 0.72 0.82 0.90  ?Sodium 135 - 145 mmol/L 139 142 140  ?Potassium 3.5 - 5.1 mmol/L 3.9 3.9 4.3  ?Chloride 98 - 111 mmol/L 105 106 102  ?CO2 22 - 32 mmol/L $RemoveB'26 27 25  'sJudyJrV$ ?Calcium 8.9 - 10.3 mg/dL 9.5 9.5 9.0  ?Total Protein 6.5 - 8.1 g/dL 6.7 7.1 6.6  ?Total Bilirubin 0.3 - 1.2 mg/dL 1.8(H) 1.5(H) 1.6(H)  ?Alkaline Phos 38 - 126 U/L 62 74 61  ?AST 15 - 41 U/L $Remo'22 18 19  'xUUKF$ ?ALT 0 - 44 U/L $Remo'16 15 13  'enbtx$ ? ? ?Lab Results  ?Component Value Date  ? WBC 6.1 05/23/2020  ? HGB 14.6 05/23/2020  ? HCT 44.2 05/23/2020  ? MCV 91.1 05/23/2020  ? PLT 271 05/23/2020  ? NEUTROABS 4.2 05/23/2020  ? ? ?ASSESSMENT & PLAN:  ?No problem-specific Assessment & Plan notes found for this encounter. ? ? ? ?No orders of the defined types were placed in this encounter. ? ?The patient has a good understanding of the overall plan. she agrees with it. she will call with any problems that may develop before the next visit here. ? ?Total time spent: *** mins including face to face time and time spent for planning, charting and coordination of care ? ?Rulon Eisenmenger, MD, MPH ?04/30/2021 ? ?I, Thana Ates, am acting as scribe for Dr. Nicholas Lose. ? ?{insert scribe attestation} ? ? ? ? ? ?

## 2021-04-30 NOTE — Assessment & Plan Note (Signed)
05/04/2020:Screening mammogram showed a left breast asymmetry. Diagnostic mammogram and US showed a 0.3cm mass at the 6 o'clock position and no left axillary adenopathy. Biopsy showed invasive and in situ ductal carcinoma, grade 2, HER-2 equivocal by IHC, negative by FISH (ratio 1.27), ER+ 95%, PR+ 90%, Ki67 10%  05/29/20:Left lumpectomy ([facility]): IDC with DCIS, 0.9cm, grade 2, clear margins, 3 left axillary lymph nodes negative for carcinoma.  Treatment Plan: 1. Oncotype Dx: 6 (distant recurrence at 9 years: 3%) 2. Adj XRTstarted 07/05/2020-08/01/2020 3. Adj Anti estrogen therapy with Anastrozole will start 08/31/20  Breast Cancer Surveillance: 1. Breast Exam: to be done annually 2. Mammograms: to be done Feb 2023 Patient did not take anastrozole yet

## 2021-05-01 ENCOUNTER — Inpatient Hospital Stay: Payer: BC Managed Care – PPO | Attending: Hematology and Oncology | Admitting: Hematology and Oncology

## 2021-05-01 ENCOUNTER — Other Ambulatory Visit: Payer: Self-pay

## 2021-05-01 ENCOUNTER — Inpatient Hospital Stay: Payer: BC Managed Care – PPO | Admitting: Hematology and Oncology

## 2021-05-01 ENCOUNTER — Telehealth: Payer: Self-pay | Admitting: Hematology and Oncology

## 2021-05-01 VITALS — BP 165/66 | HR 73 | Temp 97.9°F | Resp 18 | Ht 62.0 in | Wt 133.5 lb

## 2021-05-01 DIAGNOSIS — C50512 Malignant neoplasm of lower-outer quadrant of left female breast: Secondary | ICD-10-CM | POA: Insufficient documentation

## 2021-05-01 DIAGNOSIS — Z79811 Long term (current) use of aromatase inhibitors: Secondary | ICD-10-CM | POA: Insufficient documentation

## 2021-05-01 DIAGNOSIS — Z170 Estrogen receptor positive status [ER+]: Secondary | ICD-10-CM | POA: Insufficient documentation

## 2021-05-01 DIAGNOSIS — Z780 Asymptomatic menopausal state: Secondary | ICD-10-CM

## 2021-05-01 NOTE — Telephone Encounter (Signed)
Rescheduled appointment per clinic. Patient aware.  ?

## 2021-05-01 NOTE — Progress Notes (Addendum)
? ?Patient Care Team: ?Wayland Salinas, MD as PCP - General (Family Medicine) ?[name], MD as Consulting Physician (General Surgery) ?Nicholas Lose, MD as Consulting Physician (Hematology and Oncology) ?Eppie Gibson, MD as Attending Physician (Radiation Oncology) ? ?DIAGNOSIS:  ?Encounter Diagnoses  ?Name Primary?  ? Malignant neoplasm of lower-outer quadrant of left breast of female, estrogen receptor positive ([place])   ? Post-menopausal Yes  ? ? ?SUMMARY OF ONCOLOGIC HISTORY: ?Oncology History  ?Malignant neoplasm of lower-outer quadrant of left breast of female, estrogen receptor positive ([place])  ?05/04/2020 Initial Diagnosis  ? Screening mammogram showed a left breast asymmetry. Diagnostic mammogram and US showed a 0.3cm mass at the 6 o'clock position and no left axillary adenopathy. Biopsy showed invasive and in situ ductal carcinoma, grade 2, HER-2 equivocal by IHC, negative by FISH (ratio 1.27), ER+ 95%, PR+ 90%, Ki67 10%. ?  ?05/09/2020 Cancer Staging  ? Staging form: Breast, AJCC 8th Edition ?- Clinical stage from 05/09/2020: Stage IA (cT1a, cN0, cM0, G1, ER+, PR+, HER2-) - Signed by Nicholas Lose, MD on 05/09/2020 ?Stage prefix: Initial diagnosis ? ?  ?05/29/2020 Surgery  ? Left lumpectomy ([facility]): IDC with DCIS, 0.9cm, grade 2, clear margins, 3 left axillary lymph nodes negative for carcinoma. ?  ?06/13/2020 Cancer Staging  ? Staging form: Breast, AJCC 8th Edition ?- Pathologic: Stage IA (pT1b, pN0, cM0, G2, ER+, PR+, HER2-) - Signed by Gardenia Phlegm, NP on 06/13/2020 ?Histologic grading system: 3 grade system ? ?  ?06/13/2020 Oncotype testing  ? Oncotype score: 6: Distant risk of recurrence at 9 years: 3% ?  ?07/04/2020 - 08/01/2020 Radiation Therapy  ? Adjuvant radiation ?  ?02/20/2021 Genetic Testing  ? Negative genetic testing on the CancerNext-Expanded+RNAinsight panel.  PMS2 p.P404R VUS identified. The report date is 02/19/2021. ? ?The CancerNext-Expanded gene panel offered by [facility] and includes sequencing and rearrangement analysis for the following 77 genes: AIP, ALK, APC*, ATM*, AXIN2, BAP1, BARD1, BLM, BMPR1A, BRCA1*, BRCA2*, BRIP1*, CDC73, CDH1*, CDK4, CDKN1B, CDKN2A, CHEK2*, CTNNA1, DICER1, FANCC, FH, FLCN, GALNT12, KIF1B, LZTR1, MAX, MEN1, MET, MLH1*, MSH2*, MSH3, MSH6*, MUTYH*, NBN, NF1*, NF2, NTHL1, PALB2*, PHOX2B, PMS2*, POT1, PRKAR1A, PTCH1, PTEN*, RAD51C*, RAD51D*, RB1, RECQL, RET, SDHA, SDHAF2, SDHB, SDHC, SDHD, SMAD4, SMARCA4, SMARCB1, SMARCE1, STK11, SUFU, TMEM127, TP53*, TSC1, TSC2, VHL and XRCC2 (sequencing and deletion/duplication); EGFR, EGLN1, HOXB13, KIT, MITF, PDGFRA, POLD1, and POLE (sequencing only); EPCAM and GREM1 (deletion/duplication only). DNA and RNA analyses performed for * genes. ?  ? ? ?CHIEF COMPLIANT: Follow-up on anastrozole therapy ? ?INTERVAL HISTORY: [name] is a 61 year old with above-mentioned history of left breast cancer treated with lumpectomy radiation and is currently on antiestrogen therapy with anastrozole.  She is tolerating anastrozole extremely well without any problems or concerns.  She denies any lumps or nodules in the breast. ? ? ?ALLERGIES:  has No Known Allergies. ? ?MEDICATIONS:  ?Current Outpatient Medications  ?Medication Sig Dispense Refill  ? anastrozole (ARIMIDEX) 1 MG tablet Take 1 tablet (1 mg total) by mouth daily. 90 tablet 3  ? calcium carbonate (OS-CAL) 600 MG TABS tablet 1,200 mg.    ? Menatetrenone (VITAMIN K2) 100 MCG TABS     ? tretinoin (RETIN-A) 0.05 % cream Apply 1 application topically at bedtime.    ? XIIDRA 5 % SOLN Place 1 drop into both eyes in the morning.    ? ?No current facility-administered medications for this visit.  ? ? ?PHYSICAL EXAMINATION: ?ECOG PERFORMANCE STATUS: 1 - Symptomatic but completely ambulatory ? ?  Vitals:  ? 05/01/21 1436  ?BP: (!) 165/66  ?Pulse: 73  ?Resp: 18  ?Temp: 97.9 ?F (36.6 ?C)  ?SpO2: 100%  ? ?Filed Weights  ? 05/01/21 1436  ?Weight: 133 lb 8 oz (60.6 kg)   ? ? ?BREAST: No palpable masses or nodules in either right or left breasts. No palpable axillary supraclavicular or infraclavicular adenopathy no breast tenderness or nipple discharge. (exam performed in the presence of a chaperone) ? ?LABORATORY DATA:  ?I have reviewed the data as listed ?CMP Latest Ref Rng & Units 05/23/2020 05/09/2020 03/13/2016  ?Glucose 70 - 99 mg/dL 85 95 69  ?BUN 6 - 20 mg/dL $Remove'17 18 20  'EkCxmXo$ ?Creatinine 0.44 - 1.00 mg/dL 0.72 0.82 0.90  ?Sodium 135 - 145 mmol/L 139 142 140  ?Potassium 3.5 - 5.1 mmol/L 3.9 3.9 4.3  ?Chloride 98 - 111 mmol/L 105 106 102  ?CO2 22 - 32 mmol/L $RemoveB'26 27 25  'BXdYdBmW$ ?Calcium 8.9 - 10.3 mg/dL 9.5 9.5 9.0  ?Total Protein 6.5 - 8.1 g/dL 6.7 7.1 6.6  ?Total Bilirubin 0.3 - 1.2 mg/dL 1.8(H) 1.5(H) 1.6(H)  ?Alkaline Phos 38 - 126 U/L 62 74 61  ?AST 15 - 41 U/L $Remo'22 18 19  'fVQJp$ ?ALT 0 - 44 U/L $Remo'16 15 13  'uhuSN$ ? ? ?Lab Results  ?Component Value Date  ? WBC 6.1 05/23/2020  ? HGB 14.6 05/23/2020  ? HCT 44.2 05/23/2020  ? MCV 91.1 05/23/2020  ? PLT 271 05/23/2020  ? NEUTROABS 4.2 05/23/2020  ? ? ?ASSESSMENT & PLAN:  ?Malignant neoplasm of lower-outer quadrant of left breast of female, estrogen receptor positive ([place]) ?05/04/2020:Screening mammogram showed a left breast asymmetry. Diagnostic mammogram and US showed a 0.3cm mass at the 6 o'clock position and no left axillary adenopathy. Biopsy showed invasive and in situ ductal carcinoma, grade 2, HER-2 equivocal by IHC, negative by FISH (ratio 1.27), ER+ 95%, PR+ 90%, Ki67 10% ?  ?05/29/20: Left lumpectomy ([facility]): IDC with DCIS, 0.9cm, grade 2, clear margins, 3 left axillary lymph nodes negative for carcinoma. ?  ?Treatment Plan: ?1. Oncotype Dx: 6 (distant recurrence at 9 years: 3%)  ?2. Adj XRT started 07/05/2020-08/01/2020 ?3. Adj Anti estrogen therapy with Anastrozole started June 2022 ? ?Anastrozole toxicities: None ?  ?Breast Cancer Surveillance: ?1. Breast Exam: 05/01/2021: Benign ?2. Mammograms: 04/23/2021: Benign breast density category B ? ?Previous bone  density 2022: T score -2: I would like to recheck her bone density in the next month to see how the anastrozole is affecting her bones. ? ?Return to clinic in 1 year for follow-up ? ? ? ?Orders Placed This Encounter  ?Procedures  ? DG Bone Density  ?  Standing Status:   Future  ?  Standing Expiration Date:   05/01/2022  ?  Order Specific Question:   Reason for Exam (SYMPTOM  OR DIAGNOSIS REQUIRED)  ?  Answer:   Post menopausal on Anti estrogen therapy to assess worsening of bone density  ?  Order Specific Question:   Is the patient pregnant?  ?  Answer:   No  ?  Order Specific Question:   Preferred imaging location?  ?  Answer:   [facility]  ?  Order Specific Question:   Release to patient  ?  Answer:   Immediate  ? ?The patient has a good understanding of the overall plan. she agrees with it. she will call with any problems that may develop before the next visit here. ?Total time spent: 30 mins including face to face  time and time spent for planning, charting and co-ordination of care ? ? Harriette Ohara, MD ?05/01/21 ? ? ? ?

## 2021-05-01 NOTE — Assessment & Plan Note (Signed)
05/04/2020:Screening mammogram showed a left breast asymmetry. Diagnostic mammogram and US showed a 0.3cm mass at the 6 o'clock position and no left axillary adenopathy. Biopsy showed invasive and in situ ductal carcinoma, grade 2, HER-2 equivocal by IHC, negative by FISH (ratio 1.27), ER+ 95%, PR+ 90%, Ki67 10% ?? ?05/29/20:?Left lumpectomy ([facility]): IDC with DCIS, 0.9cm, grade 2, clear margins, 3 left axillary lymph nodes negative for carcinoma. ?? ?Treatment Plan: ?1. Oncotype Dx: 6 (distant recurrence at 9 years: 3%)? ?2. Adj XRT?started 07/05/2020-08/01/2020 ?3. Adj Anti estrogen therapy with Anastrozole will start 08/31/20 ?? ?Breast Cancer Surveillance: ?1. Breast Exam: to be done annually ?2. Mammograms: to be done Feb 2023 ?Patient did not take anastrozole yet ?

## 2021-05-09 ENCOUNTER — Other Ambulatory Visit: Payer: Self-pay | Admitting: *Deleted

## 2021-05-09 DIAGNOSIS — Z79811 Long term (current) use of aromatase inhibitors: Secondary | ICD-10-CM

## 2021-05-09 DIAGNOSIS — Z170 Estrogen receptor positive status [ER+]: Secondary | ICD-10-CM

## 2021-05-09 DIAGNOSIS — Z780 Asymptomatic menopausal state: Secondary | ICD-10-CM

## 2021-05-29 ENCOUNTER — Other Ambulatory Visit: Payer: BC Managed Care – PPO

## 2021-06-03 ENCOUNTER — Encounter: Payer: Self-pay | Admitting: Hematology and Oncology

## 2021-06-24 ENCOUNTER — Ambulatory Visit: Payer: BC Managed Care – PPO | Attending: Family Medicine

## 2021-06-24 VITALS — Wt 136.1 lb

## 2021-06-24 DIAGNOSIS — Z483 Aftercare following surgery for neoplasm: Secondary | ICD-10-CM | POA: Insufficient documentation

## 2021-06-24 NOTE — Therapy (Signed)
?OUTPATIENT PHYSICAL THERAPY SOZO SCREENING NOTE ? ? ?Patient Name: [name] ?MRN: [id number] ?DOB:Dec 07, 1960, 61 y.o., female ?Today's Date: 06/24/2021 ? ?PCP: Wayland Salinas, MD ?REFERRING PROVIDER: Wayland Salinas, * ? ? PT End of Session - 06/24/21 1532   ? ? Visit Number 8   # unchanged due to screen only  ? PT Start Time 2725   ? PT Stop Time 1536   ? PT Time Calculation (min) 5 min   ? Activity Tolerance Patient tolerated treatment well   ? Behavior During Therapy [facility] for tasks assessed/performed   ? ?  ?  ? ?  ? ? ?Past Medical History:  ?Diagnosis Date  ? Abnormal Pap smear   ? ASCUS/LGSIL  ? Breast cancer ([place]) 05/2020  ? left breast  ? Chronic kidney disease   ? horse shoe shaped kidneys  ? Family history of bladder cancer   ? Family history of breast cancer   ? Family history of colon cancer   ? Family history of ovarian cancer   ? Gilbert's syndrome   ? pt denies problems  ? History of kidney stones   ? Infertility   ? Seen by RE @ [facility]  ? Osteopenia   ? PONV (postoperative nausea and vomiting)   ? Vaginitis, atrophic   ? ?Past Surgical History:  ?Procedure Laterality Date  ? BREAST BIOPSY Left   ? BREAST LUMPECTOMY Left 05/29/2020  ? BREAST LUMPECTOMY WITH RADIOACTIVE SEED AND SENTINEL LYMPH NODE BIOPSY Left 05/29/2020  ? Procedure: LEFT BREAST LUMPECTOMY WITH RADIOACTIVE SEED AND LEFT SENTINEL LYMPH NODE BIOPSY;  Surgeon: [name], MD;  Location: [facility];  Service: General;  Laterality: Left;  P  ? CESAREAN SECTION  12/17/1993  ? [name]  ? DIAGNOSTIC LAPAROSCOPY    ? HYSTEROSCOPY WITH D & C N/A 03/09/2014  ? Procedure: DILATATION AND CURETTAGE /HYSTEROSCOPY;  Surgeon: [name], MD;  Location: [facility];  Service: Gynecology;  Laterality: N/A;  ? kidney stone removal  2010  ? monalisa    ? ?Patient Active Problem List  ? Diagnosis Date Noted  ? Genetic testing 02/20/2021  ? Family history of breast cancer 01/30/2021  ? Family history of colon cancer 01/30/2021  ? Family  history of ovarian cancer 01/30/2021  ? Family history of bladder cancer 01/30/2021  ? Decreased functional activity tolerance 12/26/2020  ? Malignant neoplasm of lower-outer quadrant of left breast of female, estrogen receptor positive ([place]) 05/04/2020  ? Complex renal cyst 12/10/2016  ? Hemorrhoids, internal 08/11/2016  ? History of kidney stones 06/10/2016  ? 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 Gilbert's disease 06/18/2011  ? Hx of abnormal cervical Pap smear 09/17/2010  ? Endometrial hyperplasia, simple 09/17/2010  ? ? ?REFERRING DIAG: left breast cancer at risk for lymphedema ? ?THERAPY DIAG:  ?Aftercare following surgery for neoplasm ? ?PERTINENT HISTORY: Patient was diagnosed on 04/19/2020 with left grade II invasive ductal carcinoma breast cancer. She had left lumpectomy and sentinel node biopsy (3 negative nodes) on 05/29/2020. It is ER/PR positive and HER2 negative with a Ki67 of 10%. ? ?PRECAUTIONS: left UE Lymphedema risk, None ? ?SUBJECTIVE: Pt returns for her 3 month L-Dex screen.  ? ?PAIN:  ?Are you having pain? No ? ?SOZO SCREENING: ?Patient was assessed today using the SOZO machine to determine the lymphedema index score. This was compared to her baseline score. It was determined that she is within the recommended range when compared to her baseline and no further action  is needed at this time. She will continue SOZO screenings. These are done every 3 months for 2 years post operatively followed by every 6 months for 2 years, and then annually. ? ? ?Otelia Limes, PTA ?06/24/2021, 3:38 PM ? ?  ? ?

## 2021-07-27 ENCOUNTER — Other Ambulatory Visit: Payer: Self-pay | Admitting: Hematology and Oncology

## 2021-08-07 ENCOUNTER — Other Ambulatory Visit: Payer: BC Managed Care – PPO

## 2021-08-07 ENCOUNTER — Ambulatory Visit (INDEPENDENT_AMBULATORY_CARE_PROVIDER_SITE_OTHER): Payer: BC Managed Care – PPO

## 2021-08-07 DIAGNOSIS — C50512 Malignant neoplasm of lower-outer quadrant of left female breast: Secondary | ICD-10-CM

## 2021-08-07 DIAGNOSIS — Z79811 Long term (current) use of aromatase inhibitors: Secondary | ICD-10-CM

## 2021-08-07 DIAGNOSIS — Z780 Asymptomatic menopausal state: Secondary | ICD-10-CM | POA: Diagnosis not present

## 2021-08-07 DIAGNOSIS — Z170 Estrogen receptor positive status [ER+]: Secondary | ICD-10-CM

## 2021-08-09 ENCOUNTER — Telehealth: Payer: Self-pay

## 2021-08-09 NOTE — Telephone Encounter (Signed)
Called and spoke with pt, informed her I was calling with bone density report, pt aware and seen on mychart, pt currently taking calcium and vit d, pt also walks regularly.  Informed pt we will repeat in 2 years.  Pt verbalized understanding and thanks

## 2021-08-09 NOTE — Telephone Encounter (Signed)
-----   Message from Gardenia Phlegm, NP sent at 08/07/2021 11:15 PM EDT ----- Bone density shows osteopenia.  Recommend calcium, vitamin d, weight bearing exercises, and repeat in 2 years ----- Message ----- From: Interface, Rad Results In Sent: 08/07/2021  12:03 PM EDT To: Nicholas Lose, MD

## 2021-09-02 ENCOUNTER — Ambulatory Visit: Payer: BC Managed Care – PPO | Attending: Surgery

## 2021-09-02 VITALS — Wt 141.1 lb

## 2021-09-02 DIAGNOSIS — Z483 Aftercare following surgery for neoplasm: Secondary | ICD-10-CM | POA: Insufficient documentation

## 2021-09-02 NOTE — Therapy (Signed)
OUTPATIENT PHYSICAL THERAPY SOZO SCREENING NOTE   Patient Name: [name] MRN: [id number] DOB:Feb 12, 1961, 61 y.o., female Today's Date: 09/02/2021  PCP: Wayland Salinas, MD REFERRING PROVIDER: [name], MD   PT End of Session - 09/02/21 1047     Visit Number 8   # unchanged due to screen only   PT Start Time 1045    PT Stop Time 1049    PT Time Calculation (min) 4 min    Activity Tolerance Patient tolerated treatment well    Behavior During Therapy [facility] for tasks assessed/performed             Past Medical History:  Diagnosis Date   Abnormal Pap smear    ASCUS/LGSIL   Breast cancer ([place]) 05/2020   left breast   Chronic kidney disease    horse shoe shaped kidneys   Family history of bladder cancer    Family history of breast cancer    Family history of colon cancer    Family history of ovarian cancer    Gilbert's syndrome    pt denies problems   History of kidney stones    Infertility    Seen by RE @ [facility]   Osteopenia    PONV (postoperative nausea and vomiting)    Vaginitis, atrophic    Past Surgical History:  Procedure Laterality Date   BREAST BIOPSY Left    BREAST LUMPECTOMY Left 05/29/2020   BREAST LUMPECTOMY WITH RADIOACTIVE SEED AND SENTINEL LYMPH NODE BIOPSY Left 05/29/2020   Procedure: LEFT BREAST LUMPECTOMY WITH RADIOACTIVE SEED AND LEFT SENTINEL LYMPH NODE BIOPSY;  Surgeon: [name], MD;  Location: [facility];  Service: General;  Laterality: Left;  P   CESAREAN SECTION  12/17/1993   [name]   DIAGNOSTIC LAPAROSCOPY     HYSTEROSCOPY WITH D & C N/A 03/09/2014   Procedure: DILATATION AND CURETTAGE /HYSTEROSCOPY;  Surgeon: [name], MD;  Location: [facility];  Service: Gynecology;  Laterality: N/A;   kidney stone removal  2010   monalisa     Patient Active Problem List   Diagnosis Date Noted   Genetic testing 02/20/2021   Family history of breast cancer 01/30/2021   Family history of colon cancer 01/30/2021   Family history  of ovarian cancer 01/30/2021   Family history of bladder cancer 01/30/2021   Decreased functional activity tolerance 12/26/2020   Malignant neoplasm of lower-outer quadrant of left breast of female, estrogen receptor positive ([place]) 05/04/2020   Complex renal cyst 12/10/2016   Hemorrhoids, internal 08/11/2016   History of kidney stones 06/10/2016   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 She had left lumpectomy and sentinel node biopsy (3 negative nodes) on 05/29/2020. It is ER/PR positive and HER2 negative with a Ki67 of 10%.  PRECAUTIONS: left UE Lymphedema risk, None  SUBJECTIVE: Pt returns for her 3 month L-Dex screen.   PAIN:  Are you having pain? No  SOZO SCREENING: Patient was assessed today using the SOZO machine to determine the lymphedema index score. This was compared to her baseline score. It was determined that she is within the recommended range when compared to her baseline and no further action is  needed at this time. She will continue SOZO screenings. These are done every 3 months for 2 years post operatively followed by every 6 months for 2 years, and then annually.   Otelia Limes, PTA 09/02/2021, 10:56 AM

## 2021-09-05 ENCOUNTER — Encounter (HOSPITAL_COMMUNITY): Payer: Self-pay

## 2021-10-09 ENCOUNTER — Ambulatory Visit: Payer: BC Managed Care – PPO

## 2021-10-14 ENCOUNTER — Ambulatory Visit: Payer: BC Managed Care – PPO

## 2021-10-17 ENCOUNTER — Telehealth: Payer: Self-pay | Admitting: *Deleted

## 2021-10-17 NOTE — Telephone Encounter (Signed)
Received call from pt stating her insurance company is refusing to cover recent diagnostic mammogram and her out of pocket portion is over $600.  Pt requesting advice from off and wanting to know why a diagnostic mammogram was ordered.  RN educated pt that due to recent dx of breast cancer in 2022 and current guidelines, a diagnostic mammogram has to be preformed for two years after diagnosis.  After the two year timeframe if diagnostic mammograms have been normal, pt may return to screening mammograms. Pt educated to [employer] and explain that she was recently diagnosed with breast cancer in 2022 and current guidelines recommend diagnostic for 2 years.  Pt verbalized understanding and states she will reach out.

## 2021-10-23 ENCOUNTER — Ambulatory Visit: Payer: BC Managed Care – PPO | Attending: Surgery

## 2021-10-23 VITALS — Wt 135.1 lb

## 2021-10-23 DIAGNOSIS — Z483 Aftercare following surgery for neoplasm: Secondary | ICD-10-CM | POA: Insufficient documentation

## 2021-10-23 NOTE — Therapy (Signed)
OUTPATIENT PHYSICAL THERAPY SOZO SCREENING NOTE   Patient Name: [name] MRN: [id number] DOB:[birth year], 61 y.o., female Today's Date: 10/23/2021  PCP: Wayland Salinas, MD REFERRING PROVIDER: [name], MD   PT End of Session - 10/23/21 1155     Visit Number 8   # unchanged due to screen only   PT Start Time 1152    PT Stop Time 1202    PT Time Calculation (min) 10 min    Activity Tolerance Patient tolerated treatment well    Behavior During Therapy [facility] for tasks assessed/performed             Past Medical History:  Diagnosis Date   Abnormal Pap smear    ASCUS/LGSIL   Breast cancer ([place]) 05/2020   left breast   Chronic kidney disease    horse shoe shaped kidneys   Family history of bladder cancer    Family history of breast cancer    Family history of colon cancer    Family history of ovarian cancer    Gilbert's syndrome    pt denies problems   History of kidney stones    Infertility    Seen by RE @ [facility]   Osteopenia    PONV (postoperative nausea and vomiting)    Vaginitis, atrophic    Past Surgical History:  Procedure Laterality Date   BREAST BIOPSY Left    BREAST LUMPECTOMY Left 05/29/2020   BREAST LUMPECTOMY WITH RADIOACTIVE SEED AND SENTINEL LYMPH NODE BIOPSY Left 05/29/2020   Procedure: LEFT BREAST LUMPECTOMY WITH RADIOACTIVE SEED AND LEFT SENTINEL LYMPH NODE BIOPSY;  Surgeon: [name], MD;  Location: [place];  Service: General;  Laterality: Left;  P   CESAREAN SECTION  12/17/1993   [name]   DIAGNOSTIC LAPAROSCOPY     HYSTEROSCOPY WITH D & C N/A 03/09/2014   Procedure: DILATATION AND CURETTAGE /HYSTEROSCOPY;  Surgeon: [name], MD;  Location: [facility] [facility];  Service: Gynecology;  Laterality: N/A;   kidney stone removal  2010   [place]     Patient Active Problem List   Diagnosis Date Noted   Genetic testing 02/20/2021   Family history of breast cancer 01/30/2021   Family history of colon cancer 01/30/2021   Family history  of ovarian cancer 01/30/2021   Family history of bladder cancer 01/30/2021   Decreased functional activity tolerance 12/26/2020   Malignant neoplasm of lower-outer quadrant of left breast of female, estrogen receptor positive ([place]) 05/04/2020   Complex renal cyst 12/10/2016   Hemorrhoids, internal 08/11/2016   History of kidney stones 06/10/2016   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 She had left lumpectomy and sentinel node biopsy (3 negative nodes) on 05/29/2020. It is ER/PR positive and HER2 negative with a Ki67 of 10%.  PRECAUTIONS: left UE Lymphedema risk, None  SUBJECTIVE: Pt returns for her 3 month L-Dex screen.   PAIN:  Are you having pain? No  SOZO SCREENING: Patient was assessed today using the SOZO machine to determine the lymphedema index score. This was compared to her baseline score. It was determined that she is within the recommended range when compared to her baseline and no further action is needed  at this time. She will continue SOZO screenings. These are done every 3 months for 2 years post operatively followed by every 6 months for 2 years, and then annually.   L-DEX FLOWSHEETS - 10/23/21 1200       L-DEX LYMPHEDEMA SCREENING   Measurement Type Unilateral    L-DEX MEASUREMENT EXTREMITY Upper Extremity    POSITION  Standing    DOMINANT SIDE Right    At Risk Side Left    BASELINE SCORE (UNILATERAL) -4.1    L-DEX SCORE (UNILATERAL) -1.5    VALUE CHANGE (UNILAT) 2.6              Otelia Limes, PTA 10/23/2021, 12:04 PM

## 2021-10-24 ENCOUNTER — Telehealth: Payer: Self-pay | Admitting: *Deleted

## 2021-10-24 NOTE — Telephone Encounter (Signed)
Received call from pt stating after discussing with her insurance company, they are denying to cover recent diagnostic mammogram due to the coding used.  Pt states insurance company is requesting appeal or P2P.  RN sent message to revenue cycle team for an appeal to be processed.

## 2021-12-02 ENCOUNTER — Telehealth: Payer: Self-pay | Admitting: Physical Therapy

## 2021-12-02 NOTE — Telephone Encounter (Signed)
Pt. Phoned the clinic to ask about the safety of using an infrared sauna and/or a salt room and if those are safe to use with her risk of arm lymphedema s/p breast cancer treatment. I explained that there is no research related to either of those things related to lymphedema risk and that while I can't think of a reason a salt room would be an issue, the safest thing is to not do it. With infrared sauna use, we don't typically recommend saunas as they create increased blood flow to extremities which could overload the lymphatics. She stated she didn't feel strongly that she needed to do either of those things so she would probably opt out. Annia Friendly, [place] 12/02/21 4:22 PM

## 2021-12-03 ENCOUNTER — Other Ambulatory Visit: Payer: Self-pay | Admitting: Hematology and Oncology

## 2021-12-03 DIAGNOSIS — Z853 Personal history of malignant neoplasm of breast: Secondary | ICD-10-CM

## 2021-12-09 ENCOUNTER — Ambulatory Visit: Payer: BC Managed Care – PPO

## 2022-01-27 ENCOUNTER — Ambulatory Visit: Payer: BC Managed Care – PPO | Attending: Surgery

## 2022-01-27 VITALS — Wt 136.2 lb

## 2022-01-27 DIAGNOSIS — Z483 Aftercare following surgery for neoplasm: Secondary | ICD-10-CM | POA: Insufficient documentation

## 2022-01-27 NOTE — Therapy (Signed)
OUTPATIENT PHYSICAL THERAPY SOZO SCREENING NOTE   Patient Name: [name] MRN: [id number] DOB:Jul 30, 1960, 61 y.o., female Today's Date: 01/27/2022  PCP: Wayland Salinas, MD REFERRING PROVIDER: [name], MD   PT End of Session - 01/27/22 1046     Visit Number 8   # unchanged due to screen only   PT Start Time 4098    PT Stop Time 1052    PT Time Calculation (min) 7 min    Activity Tolerance Patient tolerated treatment well    Behavior During Therapy [facility] for tasks assessed/performed             Past Medical History:  Diagnosis Date   Abnormal Pap smear    ASCUS/LGSIL   Breast cancer ([place]) 05/2020   left breast   Chronic kidney disease    horse shoe shaped kidneys   Family history of bladder cancer    Family history of breast cancer    Family history of colon cancer    Family history of ovarian cancer    Gilbert's syndrome    pt denies problems   History of kidney stones    Infertility    Seen by RE @ [facility]   Osteopenia    PONV (postoperative nausea and vomiting)    Vaginitis, atrophic    Past Surgical History:  Procedure Laterality Date   BREAST BIOPSY Left    BREAST LUMPECTOMY Left 05/29/2020   BREAST LUMPECTOMY WITH RADIOACTIVE SEED AND SENTINEL LYMPH NODE BIOPSY Left 05/29/2020   Procedure: LEFT BREAST LUMPECTOMY WITH RADIOACTIVE SEED AND LEFT SENTINEL LYMPH NODE BIOPSY;  Surgeon: [name], MD;  Location: [place];  Service: General;  Laterality: Left;  P   CESAREAN SECTION  12/17/1993   [name]   DIAGNOSTIC LAPAROSCOPY     HYSTEROSCOPY WITH D & C N/A 03/09/2014   Procedure: DILATATION AND CURETTAGE /HYSTEROSCOPY;  Surgeon: [name], MD;  Location: [place] [facility];  Service: Gynecology;  Laterality: N/A;   kidney stone removal  2010   monalisa     Patient Active Problem List   Diagnosis Date Noted   Genetic testing 02/20/2021   Family history of breast cancer 01/30/2021   Family history of colon cancer 01/30/2021   Family history  of ovarian cancer 01/30/2021   Family history of bladder cancer 01/30/2021   Decreased functional activity tolerance 12/26/2020   Malignant neoplasm of lower-outer quadrant of left breast of female, estrogen receptor positive ([place]) 05/04/2020   Complex renal cyst 12/10/2016   Hemorrhoids, internal 08/11/2016   History of kidney stones 06/10/2016   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 She had left lumpectomy and sentinel node biopsy (3 negative nodes) on 05/29/2020. It is ER/PR positive and HER2 negative with a Ki67 of 10%.  PRECAUTIONS: left UE Lymphedema risk, None  SUBJECTIVE: Pt returns for her 3 month L-Dex screen.   PAIN:  Are you having pain? No  SOZO SCREENING: Patient was assessed today using the SOZO machine to determine the lymphedema index score. This was compared to her baseline score. It was determined that she is within the recommended range when compared to her baseline and no further action is needed  at this time. She will continue SOZO screenings. These are done every 3 months for 2 years post operatively followed by every 6 months for 2 years, and then annually.   L-DEX FLOWSHEETS - 01/27/22 1000       L-DEX LYMPHEDEMA SCREENING   Measurement Type Unilateral    L-DEX MEASUREMENT EXTREMITY Upper Extremity    POSITION  Standing    DOMINANT SIDE Right    At Risk Side Left    BASELINE SCORE (UNILATERAL) -4.1    L-DEX SCORE (UNILATERAL) -3.5    VALUE CHANGE (UNILAT) 0.6              Otelia Limes, PTA 01/27/2022, 10:52 AM

## 2022-02-14 ENCOUNTER — Telehealth: Payer: Self-pay | Admitting: *Deleted

## 2022-02-14 NOTE — Telephone Encounter (Signed)
Received call from pt requesting script be sent to [facility] to be fitted for a bra.  Prescription successfully faxed.

## 2022-03-05 ENCOUNTER — Encounter: Payer: Self-pay | Admitting: *Deleted

## 2022-03-05 ENCOUNTER — Telehealth: Payer: Self-pay | Admitting: *Deleted

## 2022-03-05 NOTE — Telephone Encounter (Signed)
Received call from pt requesting letter from MD regarding the need to have Diagnostic Mammogram.  Letter typed and mailed to pt address on file.

## 2022-04-08 NOTE — Progress Notes (Unsigned)
   ANNUAL EXAM Patient name: [name] MRN [id number]  Date of birth: [birth year] Chief Complaint:   No chief complaint on file.  History of Present Illness:   [name] is a 62 y.o. G19P1011 female being seen today for a routine annual exam.   Current complaints:   2023: vaginal dryness.  She has done PFPT and vaginal lubricants.      No LMP recorded. Patient is postmenopausal.  Last pap: 04/2021. Results were: NILM w/ HRHPV negative. H/O abnormal pap: no Last MXR: Due in 04/2022 -- 2022 was diagnosed with left breast cancer   Health Maintenance Due  Topic Date Due   COVID-19 Vaccine (1) Never done   Zoster Vaccines- Shingrix (1 of 2) Never done   INFLUENZA VACCINE  10/01/2021   COLONOSCOPY (Pts 45-2yr Insurance coverage will need to be confirmed)  12/12/2021    Review of Systems:   Pertinent items are noted in HPI Denies any headaches, blurred vision, fatigue, shortness of breath, chest pain, abdominal pain, abnormal vaginal discharge/itching/odor/irritation, problems with periods, bowel movements, urination, or intercourse unless otherwise stated above. *** Pertinent History Reviewed:  Reviewed past medical,surgical, social and family history.  Reviewed problem list, medications and allergies. Physical Assessment:  There were no vitals filed for this visit.There is no height or weight on file to calculate BMI.   Physical Examination:  General appearance - well appearing, and in no distress Mental status - alert, oriented to person, place, and time Psych:  She has a normal mood and affect Skin - warm and dry, normal color, no suspicious lesions noted Chest - effort normal Heart - normal rate  Breasts - breasts appear normal, no suspicious masses, no skin or nipple changes or axillary nodes Abdomen - soft, nontender, nondistended, no masses or organomegaly Pelvic -  VULVA: normal appearing vulva with no masses, tenderness or lesions  VAGINA: normal appearing  vagina with normal color and discharge, no lesions  CERVIX: normal appearing cervix without discharge or lesions, no CMT UTERUS: uterus is felt to be normal size, shape, consistency and nontender  ADNEXA: No adnexal masses or tenderness noted. Extremities:  No swelling or varicosities noted  Chaperone present for exam  No results found for this or any previous visit (from the past 24 hour(s)).  Assessment & Plan:  Diagnoses and all orders for this visit:  Encounter for annual routine gynecological examination  - Cervical cancer screening: Discussed guidelines. Pap with HPV normal 04/2021 - Breast Health: Encouraged self breast awareness/SBE. Discussed limits of clinical breast exam for detecting breast cancer. Discussed importance of annual MXR.  Scheduled for April 29, 2022.  - Climacteric/Sexual health: Reviewed typical and atypical symptoms of menopause/peri-menopause. Discussed PMB and to call if any amount of spotting.  - Colonoscopy:  2014 - F/U 12 months and prn     No orders of the defined types were placed in this encounter.   Meds: No orders of the defined types were placed in this encounter.   Follow-up: No follow-ups on file.  PRadene Gunning MD 04/08/2022 3:44 PM

## 2022-04-09 ENCOUNTER — Ambulatory Visit (INDEPENDENT_AMBULATORY_CARE_PROVIDER_SITE_OTHER): Payer: BC Managed Care – PPO | Admitting: Obstetrics and Gynecology

## 2022-04-09 ENCOUNTER — Encounter: Payer: Self-pay | Admitting: Obstetrics and Gynecology

## 2022-04-09 VITALS — BP 158/84 | HR 75 | Resp 16 | Ht 62.0 in | Wt 129.0 lb

## 2022-04-09 DIAGNOSIS — Z01419 Encounter for gynecological examination (general) (routine) without abnormal findings: Secondary | ICD-10-CM | POA: Diagnosis not present

## 2022-04-28 ENCOUNTER — Ambulatory Visit: Payer: BC Managed Care – PPO | Attending: Surgery

## 2022-04-28 VITALS — Wt 126.4 lb

## 2022-04-28 DIAGNOSIS — Z483 Aftercare following surgery for neoplasm: Secondary | ICD-10-CM | POA: Insufficient documentation

## 2022-04-28 NOTE — Therapy (Signed)
OUTPATIENT PHYSICAL THERAPY SOZO SCREENING NOTE   Patient Name: [name] MRN: [id number] DOB:[birth year], 62 y.o., female Today's Date: 04/28/2022  PCP: Wayland Salinas, MD REFERRING PROVIDER: [name], MD   PT End of Session - 04/28/22 1051     Visit Number 8   # unchanged due to screen only   PT Start Time 1048    PT Stop Time 1055    PT Time Calculation (min) 7 min    Activity Tolerance Patient tolerated treatment well    Behavior During Therapy [facility] for tasks assessed/performed             Past Medical History:  Diagnosis Date   Abnormal Pap smear    ASCUS/LGSIL   Breast cancer ([place]) 05/2020   left breast   Chronic kidney disease    horse shoe shaped kidneys   Family history of bladder cancer    Family history of breast cancer    Family history of colon cancer    Family history of ovarian cancer    Gilbert's syndrome    pt denies problems   History of kidney stones    Infertility    Seen by RE @ [facility]   Osteopenia    PONV (postoperative nausea and vomiting)    Vaginitis, atrophic    Past Surgical History:  Procedure Laterality Date   BREAST BIOPSY Left    BREAST LUMPECTOMY Left 05/29/2020   BREAST LUMPECTOMY WITH RADIOACTIVE SEED AND SENTINEL LYMPH NODE BIOPSY Left 05/29/2020   Procedure: LEFT BREAST LUMPECTOMY WITH RADIOACTIVE SEED AND LEFT SENTINEL LYMPH NODE BIOPSY;  Surgeon: [name], MD;  Location: [place];  Service: General;  Laterality: Left;  P   CESAREAN SECTION  12/17/1993   [name]   DIAGNOSTIC LAPAROSCOPY     HYSTEROSCOPY WITH D & C N/A 03/09/2014   Procedure: DILATATION AND CURETTAGE /HYSTEROSCOPY;  Surgeon: [name], MD;  Location: [place] [facility];  Service: Gynecology;  Laterality: N/A;   kidney stone removal  2010   monalisa     Patient Active Problem List   Diagnosis Date Noted   Genetic testing 02/20/2021   Family history of breast cancer 01/30/2021   Family history of colon cancer 01/30/2021   Family history  of ovarian cancer 01/30/2021   Family history of bladder cancer 01/30/2021   Decreased functional activity tolerance 12/26/2020   Malignant neoplasm of lower-outer quadrant of left breast of female, estrogen receptor positive ([place]) 05/04/2020   Complex renal cyst 12/10/2016   Hemorrhoids, internal 08/11/2016   History of kidney stones 06/10/2016   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 She had left lumpectomy and sentinel node biopsy (3 negative nodes) on 05/29/2020. It is ER/PR positive and HER2 negative with a Ki67 of 10%.  PRECAUTIONS: left UE Lymphedema risk, None  SUBJECTIVE: Pt returns for her 3 month L-Dex screen.   PAIN:  Are you having pain? No  SOZO SCREENING: Patient was assessed today using the SOZO machine to determine the lymphedema index score. This was compared to her baseline score. It was determined that she is within the recommended range when compared to her baseline and no further action is needed  at this time. She will continue SOZO screenings. These are done every 3 months for 2 years post operatively followed by every 6 months for 2 years, and then annually.   L-DEX FLOWSHEETS - 04/28/22 1000       L-DEX LYMPHEDEMA SCREENING   Measurement Type Unilateral    L-DEX MEASUREMENT EXTREMITY Upper Extremity    POSITION  Standing    DOMINANT SIDE Right    At Risk Side Left    BASELINE SCORE (UNILATERAL) -4.1    L-DEX SCORE (UNILATERAL) 1    VALUE CHANGE (UNILAT) 5.1              Otelia Limes, PTA 04/28/2022, 10:55 AM

## 2022-04-29 ENCOUNTER — Ambulatory Visit
Admission: RE | Admit: 2022-04-29 | Discharge: 2022-04-29 | Disposition: A | Discharge status: Home / Self Care | Payer: BC Managed Care – PPO | Source: Ambulatory Visit | Attending: Hematology and Oncology | Admitting: Hematology and Oncology

## 2022-04-29 DIAGNOSIS — Z853 Personal history of malignant neoplasm of breast: Secondary | ICD-10-CM

## 2022-05-01 NOTE — Progress Notes (Signed)
Patient Care Team: Wayland Salinas, MD as PCP - General (Family Medicine) [name], MD as Consulting Physician (General Surgery) Nicholas Lose, MD as Consulting Physician (Hematology and Oncology) Eppie Gibson, MD as Attending Physician (Radiation Oncology)  DIAGNOSIS: No diagnosis found.  SUMMARY OF ONCOLOGIC HISTORY: Oncology History  Malignant neoplasm of lower-outer quadrant of left breast of female, estrogen receptor positive ([place])  05/04/2020 Initial Diagnosis   Screening mammogram showed a left breast asymmetry. Diagnostic mammogram and US showed a 0.3cm mass at the 6 o'clock position and no left axillary adenopathy. Biopsy showed invasive and in situ ductal carcinoma, grade 2, HER-2 equivocal by IHC, negative by FISH (ratio 1.27), ER+ 95%, PR+ 90%, Ki67 10%.   05/09/2020 Cancer Staging   Staging form: Breast, AJCC 8th Edition - Clinical stage from 05/09/2020: Stage IA (cT1a, cN0, cM0, G1, ER+, PR+, HER2-) - Signed by Nicholas Lose, MD on 05/09/2020 Stage prefix: Initial diagnosis   05/29/2020 Surgery   Left lumpectomy ([place]): IDC with DCIS, 0.9cm, grade 2, clear margins, 3 left axillary lymph nodes negative for carcinoma.   06/13/2020 Cancer Staging   Staging form: Breast, AJCC 8th Edition - Pathologic: Stage IA (pT1b, pN0, cM0, G2, ER+, PR+, HER2-) - Signed by Gardenia Phlegm, NP on 06/13/2020 Histologic grading system: 3 grade system   06/13/2020 Oncotype testing   Oncotype score: 6: Distant risk of recurrence at 9 years: 3%   07/04/2020 - 08/01/2020 Radiation Therapy   Adjuvant radiation   02/20/2021 Genetic Testing   Negative genetic testing on the CancerNext-Expanded+RNAinsight panel.  PMS2 p.P404R VUS identified. The report date is 02/19/2021.  The CancerNext-Expanded gene panel offered by [facility] and includes sequencing and rearrangement analysis for the following 77 genes: AIP, ALK, APC*, ATM*, AXIN2, BAP1, BARD1, BLM, BMPR1A, BRCA1*, BRCA2*,  BRIP1*, CDC73, CDH1*, CDK4, CDKN1B, CDKN2A, CHEK2*, CTNNA1, DICER1, FANCC, FH, FLCN, GALNT12, KIF1B, LZTR1, MAX, MEN1, MET, MLH1*, MSH2*, MSH3, MSH6*, MUTYH*, NBN, NF1*, NF2, NTHL1, PALB2*, PHOX2B, PMS2*, POT1, PRKAR1A, PTCH1, PTEN*, RAD51C*, RAD51D*, RB1, RECQL, RET, SDHA, SDHAF2, SDHB, SDHC, SDHD, SMAD4, SMARCA4, SMARCB1, SMARCE1, STK11, SUFU, TMEM127, TP53*, TSC1, TSC2, VHL and XRCC2 (sequencing and deletion/duplication); EGFR, EGLN1, HOXB13, KIT, MITF, PDGFRA, POLD1, and POLE (sequencing only); EPCAM and GREM1 (deletion/duplication only). DNA and RNA analyses performed for * genes.     CHIEF COMPLIANT: Follow-up on anastrozole therapy   INTERVAL HISTORY: [name] is a 62 year old with above-mentioned history of left breast cancer treated with lumpectomy radiation and is currently on antiestrogen therapy with anastrozole.    ALLERGIES:  has No Known Allergies.  MEDICATIONS:  Current Outpatient Medications  Medication Sig Dispense Refill   anastrozole (ARIMIDEX) 1 MG tablet Take 1 tablet (1 mg total) by mouth daily. 90 tablet 3   Menatetrenone (VITAMIN K2) 100 MCG TABS      tretinoin (RETIN-A) 0.05 % cream Apply 1 application topically at bedtime.     XIIDRA 5 % SOLN Place 1 drop into both eyes in the morning.     No current facility-administered medications for this visit.    PHYSICAL EXAMINATION: ECOG PERFORMANCE STATUS: {CHL ONC ECOG PS:[phone]}  There were no vitals filed for this visit. There were no vitals filed for this visit.  BREAST:*** No palpable masses or nodules in either right or left breasts. No palpable axillary supraclavicular or infraclavicular adenopathy no breast tenderness or nipple discharge. (exam performed in the presence of a chaperone)  LABORATORY DATA:  I have reviewed the data as listed  Latest Ref Rng & Units 05/23/2020    2:00 PM 05/09/2020   12:22 PM 03/13/2016    8:15 AM  CMP  Glucose 70 - 99 mg/dL 85  95  69   BUN 6 - 20 mg/dL '17  18   20   '$ Creatinine 0.44 - 1.00 mg/dL 0.72  0.82  0.90   Sodium 135 - 145 mmol/L 139  142  140   Potassium 3.5 - 5.1 mmol/L 3.9  3.9  4.3   Chloride 98 - 111 mmol/L 105  106  102   CO2 22 - 32 mmol/L '26  27  25   '$ Calcium 8.9 - 10.3 mg/dL 9.5  9.5  9.0   Total Protein 6.5 - 8.1 g/dL 6.7  7.1  6.6   Total Bilirubin 0.3 - 1.2 mg/dL 1.8  1.5  1.6   Alkaline Phos 38 - 126 U/L 62  74  61   AST 15 - 41 U/L '22  18  19   '$ ALT 0 - 44 U/L '16  15  13     '$ Lab Results  Component Value Date   WBC 6.1 05/23/2020   HGB 14.6 05/23/2020   HCT 44.2 05/23/2020   MCV 91.1 05/23/2020   PLT 271 05/23/2020   NEUTROABS 4.2 05/23/2020    ASSESSMENT & PLAN:  No problem-specific Assessment & Plan notes found for this encounter.    No orders of the defined types were placed in this encounter.  The patient has a good understanding of the overall plan. she agrees with it. she will call with any problems that may develop before the next visit here. Total time spent: 30 mins including face to face time and time spent for planning, charting and co-ordination of care   Suzzette Righter, [place] 05/01/22    I Gardiner Coins am acting as a Education administrator for [employer]  ***

## 2022-05-05 ENCOUNTER — Inpatient Hospital Stay: Payer: BC Managed Care – PPO | Attending: Hematology and Oncology | Admitting: Hematology and Oncology

## 2022-05-05 ENCOUNTER — Other Ambulatory Visit: Payer: Self-pay

## 2022-05-05 VITALS — BP 133/83 | HR 75 | Temp 98.1°F | Resp 16 | Wt 124.3 lb

## 2022-05-05 DIAGNOSIS — C50512 Malignant neoplasm of lower-outer quadrant of left female breast: Secondary | ICD-10-CM | POA: Diagnosis not present

## 2022-05-05 DIAGNOSIS — Z79811 Long term (current) use of aromatase inhibitors: Secondary | ICD-10-CM | POA: Diagnosis not present

## 2022-05-05 DIAGNOSIS — Z170 Estrogen receptor positive status [ER+]: Secondary | ICD-10-CM | POA: Diagnosis not present

## 2022-05-05 MED ORDER — ANASTROZOLE 1 MG PO TABS: 1.0000 mg | ORAL_TABLET | Freq: Every day | ORAL | 3 refills | Status: DC

## 2022-05-05 NOTE — Assessment & Plan Note (Addendum)
05/04/2020:Screening mammogram showed a left breast asymmetry. Diagnostic mammogram and US showed a 0.3cm mass at the 6 o'clock position and no left axillary adenopathy. Biopsy showed invasive and in situ ductal carcinoma, grade 2, HER-2 equivocal by IHC, negative by FISH (ratio 1.27), ER+ 95%, PR+ 90%, Ki67 10%   05/29/20: Left lumpectomy ([place]): IDC with DCIS, 0.9cm, grade 2, clear margins, 3 left axillary lymph nodes negative for carcinoma.   Treatment Plan: 1. Oncotype Dx: 6 (distant recurrence at 9 years: 3%)  2. Adj XRT started 07/05/2020-08/01/2020 3. Adj Anti estrogen therapy with Anastrozole started June 2022   Anastrozole toxicities: None   Breast Cancer Surveillance: 1. Breast Exam: 05/05/2022: Benign 2. Mammograms: 04/29/2022: Benign breast density category B   Previous bone density 2022: T score -2: I would like to recheck her bone density in the next month to see how the anastrozole is affecting her bones.   Return to clinic in 1 and half year for follow-up to alternate with mammograms

## 2022-05-06 ENCOUNTER — Telehealth: Payer: Self-pay | Admitting: Hematology and Oncology

## 2022-05-06 NOTE — Telephone Encounter (Signed)
Unable to schedule patients appointments per Dr.Gudena's 3/4 los due to the template not being open at this time. Patient states she will call back to get an appointment made.

## 2022-05-16 IMAGING — MG DIGITAL DIAGNOSTIC BILAT W/ TOMO W/ CAD
6 of 10 series · 6 of 26 positions shown · non-contrast
Comparison: Previous exam(s).

CLINICAL DATA: History of LEFT breast cancer status post lumpectomy
in 7177.

EXAM:
DIGITAL DIAGNOSTIC BILATERAL MAMMOGRAM WITH TOMOSYNTHESIS AND CAD
TECHNIQUE: Bilateral digital diagnostic mammography and breast tomosynthesis
was performed. The images were evaluated with computer-aided
detection.

[L CC]
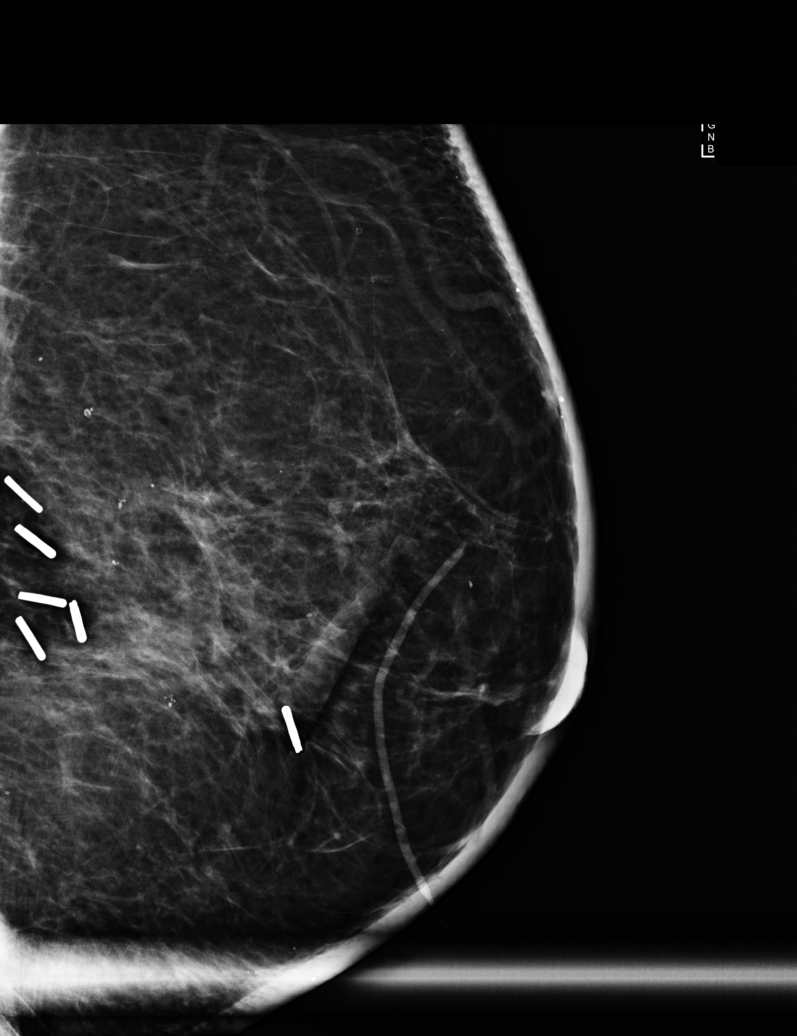

[L MLO]
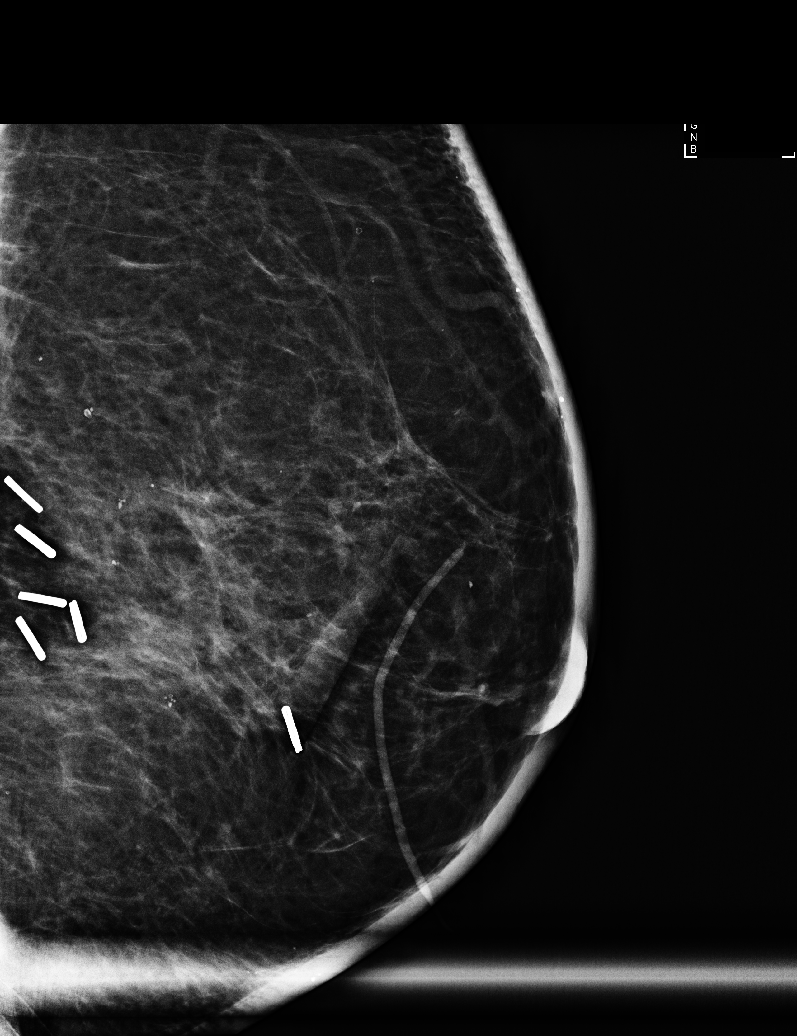

[R MLO synth-2D]
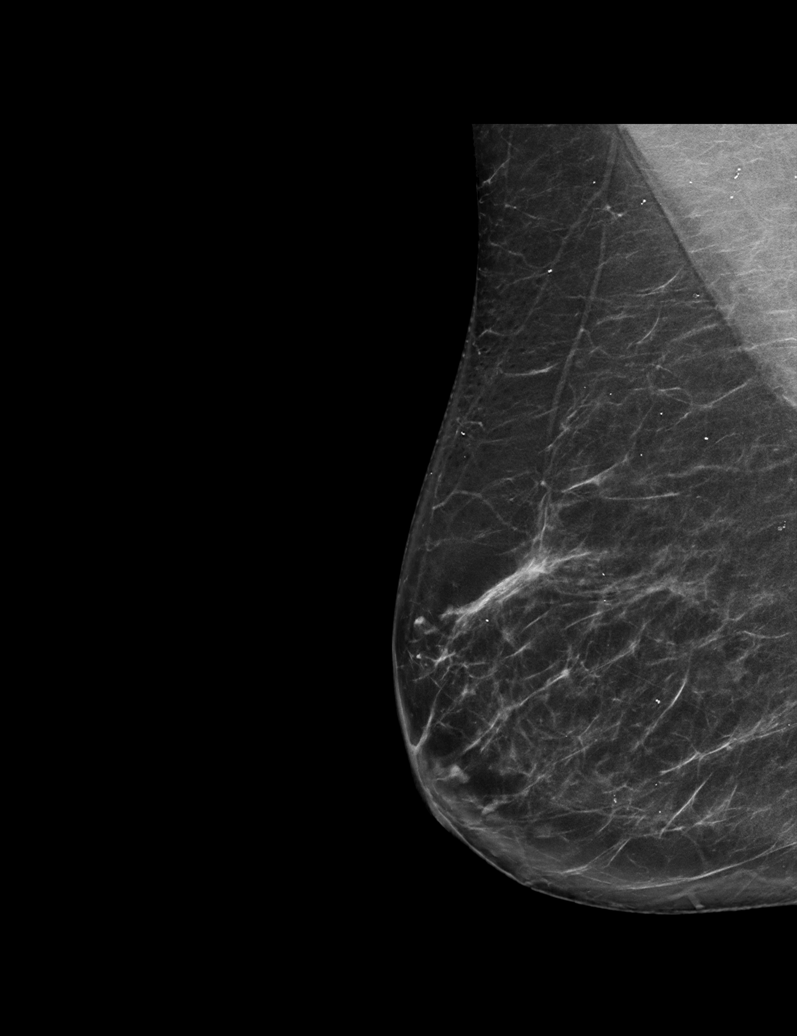

[R CC synth-2D]
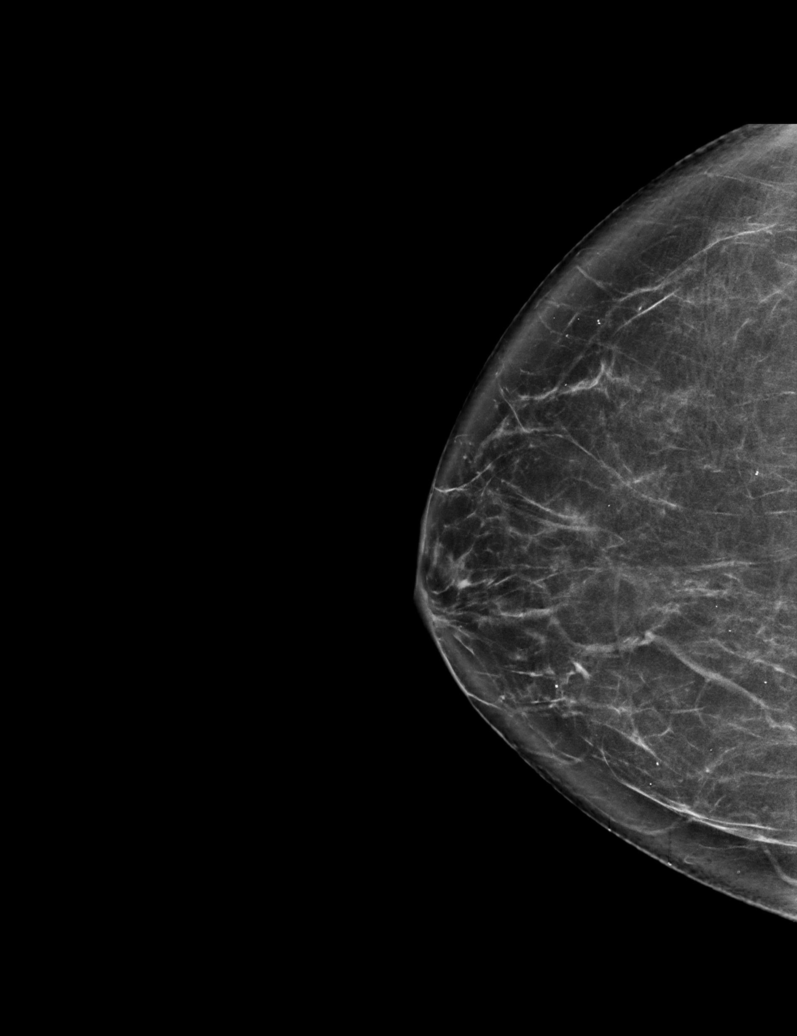

[L MLO synth-2D]
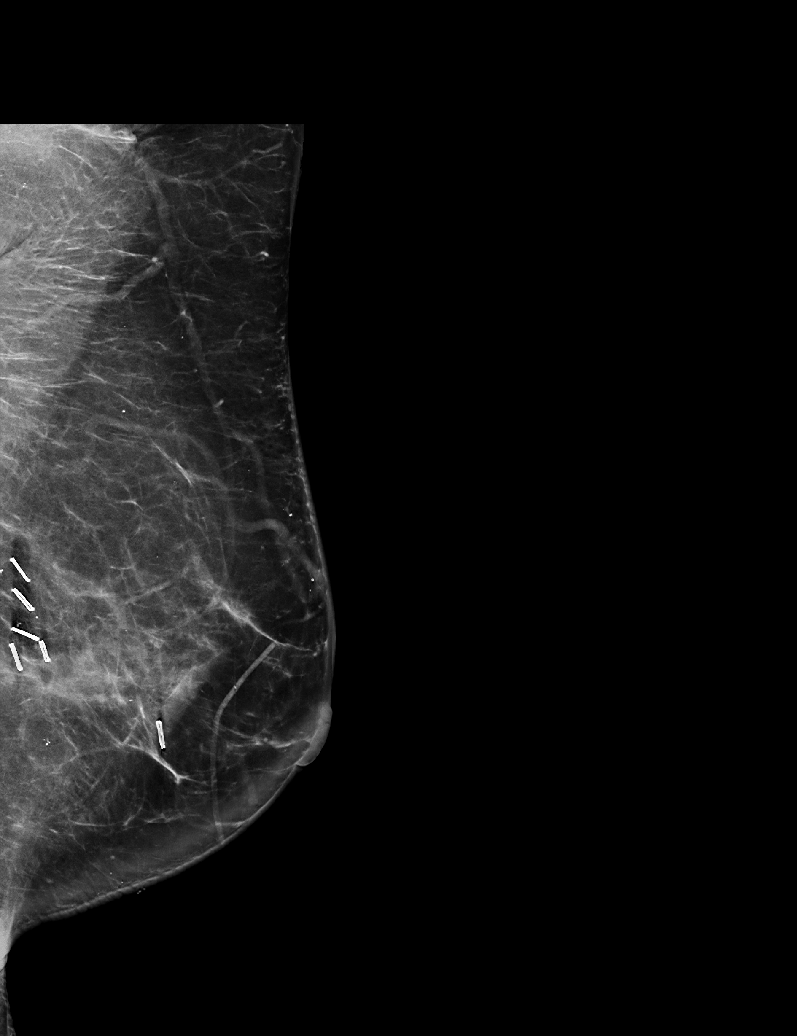

[L CC synth-2D]
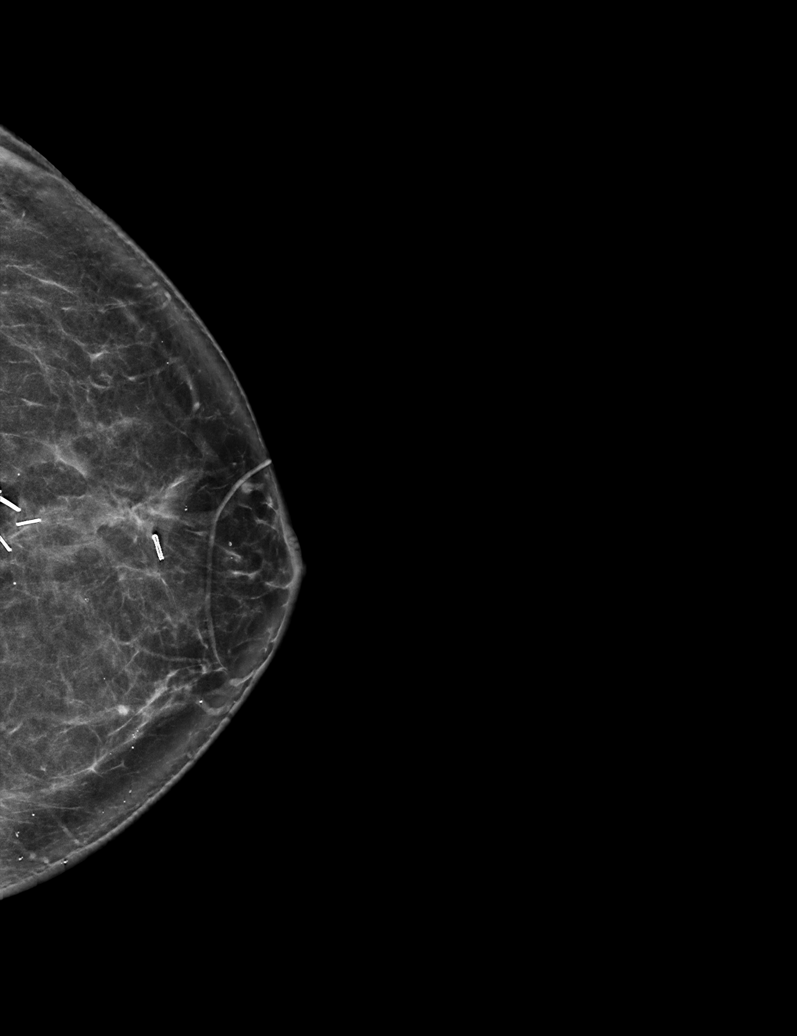

[6 of 26 positions shown; findings below may reference images not displayed]

ACR Breast Density Category b: There are scattered areas of
fibroglandular density.
FINDINGS: There are expected postsurgical changes within the LEFT breast.
There are no new dominant masses, suspicious calcifications or
secondary signs of malignancy within either breast.
IMPRESSION: No evidence of malignancy within either breast. Expected
postsurgical changes within the LEFT breast.

RECOMMENDATION:
Bilateral diagnostic mammogram in 1 year.

I have discussed the findings and recommendations with the patient.
If applicable, a reminder letter will be sent to the patient
regarding the next appointment.

BI-RADS CATEGORY  2: Benign.

## 2022-06-09 ENCOUNTER — Ambulatory Visit: Payer: BC Managed Care – PPO | Attending: Surgery

## 2022-06-09 VITALS — Wt 117.5 lb

## 2022-06-09 DIAGNOSIS — Z483 Aftercare following surgery for neoplasm: Secondary | ICD-10-CM | POA: Insufficient documentation

## 2022-06-09 NOTE — Therapy (Addendum)
OUTPATIENT PHYSICAL THERAPY SOZO SCREENING NOTE   Patient Name: [name] MRN: [id number] DOB:June 17, 1960, 62 y.o., female Today's Date: 06/09/2022  PCP: Deloris Ping, MD REFERRING PROVIDER: Harriette Bouillon, MD   PT End of Session - 06/09/22 1523     Visit Number 8   # unchanged due to screen only   PT Start Time 1522    PT Stop Time 1526    PT Time Calculation (min) 4 min    Activity Tolerance Patient tolerated treatment well    Behavior During Therapy [facility] for tasks assessed/performed             Past Medical History:  Diagnosis Date   Abnormal Pap smear    ASCUS/LGSIL   Breast cancer (HCC) 05/2020   left breast   Chronic kidney disease    horse shoe shaped kidneys   Family history of bladder cancer    Family history of breast cancer    Family history of colon cancer    Family history of ovarian cancer    Gilbert's syndrome    pt denies problems   History of kidney stones    Infertility    Seen by RE @ [facility]   Osteopenia    PONV (postoperative nausea and vomiting)    Vaginitis, atrophic    Past Surgical History:  Procedure Laterality Date   BREAST BIOPSY Left    BREAST LUMPECTOMY Left 05/29/2020   BREAST LUMPECTOMY WITH RADIOACTIVE SEED AND SENTINEL LYMPH NODE BIOPSY Left 05/29/2020   Procedure: LEFT BREAST LUMPECTOMY WITH RADIOACTIVE SEED AND LEFT SENTINEL LYMPH NODE BIOPSY;  Surgeon: Harriette Bouillon, MD;  Location: [facility] OR;  Service: General;  Laterality: Left;  P   CESAREAN SECTION  12/17/1993   [name]   DIAGNOSTIC LAPAROSCOPY     HYSTEROSCOPY WITH D & C N/A 03/09/2014   Procedure: DILATATION AND CURETTAGE /HYSTEROSCOPY;  Surgeon: Allie Bossier, MD;  Location: [facility] ORS;  Service: Gynecology;  Laterality: N/A;   kidney stone removal  2010   monalisa     Patient Active Problem List   Diagnosis Date Noted   Genetic testing 02/20/2021   Family history of breast cancer 01/30/2021   Family history of colon cancer 01/30/2021   Family history  of ovarian cancer 01/30/2021   Family history of bladder cancer 01/30/2021   Decreased functional activity tolerance 12/26/2020   Malignant neoplasm of lower-outer quadrant of left breast of female, estrogen receptor positive 05/04/2020   Complex renal cyst 12/10/2016   Hemorrhoids, internal 08/11/2016   History of kidney stones 06/10/2016   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 She had left lumpectomy and sentinel node biopsy (3 negative nodes) on 05/29/2020. It is ER/PR positive and HER2 negative with a Ki67 of 10%.  PRECAUTIONS: left UE Lymphedema risk, None  SUBJECTIVE: Pt returns for her 3 month L-Dex screen.   PAIN:  Are you having pain? No  SOZO SCREENING: Patient was assessed today using the SOZO machine to determine the lymphedema index score. This was compared to her baseline score. It was determined that she is within the recommended range when compared to her baseline and no further action is needed at  this time. She will continue SOZO screenings. These are done every 3 months for 2 years post operatively followed by every 6 months for 2 years, and then annually.  Measured pt for a Juzo Soft 2000 prophylactic sleeve as she reports she has lost 20 lbs in 2 years since she was initially measured. Pt plans to order from [email or web address]   L-DEX FLOWSHEETS - 06/09/22 1500       L-DEX LYMPHEDEMA SCREENING   Measurement Type Unilateral    L-DEX MEASUREMENT EXTREMITY Upper Extremity    POSITION  Standing    DOMINANT SIDE Right    At Risk Side Left    BASELINE SCORE (UNILATERAL) -4.1    L-DEX SCORE (UNILATERAL) -0.3    VALUE  CHANGE (UNILAT) 3.8            P: Start 6 month screens next.   Hermenia Bersosenberger, Particia Strahm Ann, PTA 06/09/2022, 3:24 PM

## 2022-07-21 ENCOUNTER — Ambulatory Visit: Payer: Self-pay

## 2022-11-17 ENCOUNTER — Inpatient Hospital Stay: Payer: BC Managed Care – PPO | Attending: Hematology and Oncology | Admitting: Hematology and Oncology

## 2022-11-17 VITALS — BP 157/71 | HR 68 | Resp 18 | Ht 62.0 in | Wt 124.0 lb

## 2022-11-17 DIAGNOSIS — C50512 Malignant neoplasm of lower-outer quadrant of left female breast: Secondary | ICD-10-CM

## 2022-11-17 DIAGNOSIS — M8580 Other specified disorders of bone density and structure, unspecified site: Secondary | ICD-10-CM | POA: Diagnosis not present

## 2022-11-17 DIAGNOSIS — Z780 Asymptomatic menopausal state: Secondary | ICD-10-CM

## 2022-11-17 DIAGNOSIS — Z170 Estrogen receptor positive status [ER+]: Secondary | ICD-10-CM

## 2022-11-17 DIAGNOSIS — Z79811 Long term (current) use of aromatase inhibitors: Secondary | ICD-10-CM | POA: Diagnosis not present

## 2022-11-17 NOTE — Assessment & Plan Note (Signed)
05/04/2020:Screening mammogram showed a left breast asymmetry. Diagnostic mammogram and US showed a 0.3cm mass at the 6 o'clock position and no left axillary adenopathy. Biopsy showed invasive and in situ ductal carcinoma, grade 2, HER-2 equivocal by IHC, negative by FISH (ratio 1.27), ER+ 95%, PR+ 90%, Ki67 10%   05/29/20: Left lumpectomy ([facility]): IDC with DCIS, 0.9cm, grade 2, clear margins, 3 left axillary lymph nodes negative for carcinoma.   Treatment Plan: 1. Oncotype Dx: 6 (distant recurrence at 9 years: 3%)  2. Adj XRT started 07/05/2020-08/01/2020 3. Adj Anti estrogen therapy with Anastrozole started June 2022   Anastrozole toxicities: None   Breast Cancer Surveillance: 1. Breast Exam: 11/17/2022: Benign 2. Mammograms: 04/29/2022: Benign breast density category B   Previous bone density 2022: T score -2: I would like to recheck her bone density in the next month to see how the anastrozole is affecting her bones.   Return to clinic in 1 year for follow-up to alternate with mammograms

## 2022-11-17 NOTE — Progress Notes (Signed)
Patient Care Team: Deloris Ping, MD as PCP - General (Family Medicine) Harriette Bouillon, MD as Consulting Physician (General Surgery) Serena Croissant, MD as Consulting Physician (Hematology and Oncology) Lonie Peak, MD as Attending Physician (Radiation Oncology)  DIAGNOSIS:  Encounter Diagnoses  Name Primary?   Malignant neoplasm of lower-outer quadrant of left breast of female, estrogen receptor positive (HCC) Yes   Post-menopausal     SUMMARY OF ONCOLOGIC HISTORY: Oncology History  Malignant neoplasm of lower-outer quadrant of left breast of female, estrogen receptor positive (HCC)  05/04/2020 Initial Diagnosis   Screening mammogram showed a left breast asymmetry. Diagnostic mammogram and US showed a 0.3cm mass at the 6 o'clock position and no left axillary adenopathy. Biopsy showed invasive and in situ ductal carcinoma, grade 2, HER-2 equivocal by IHC, negative by FISH (ratio 1.27), ER+ 95%, PR+ 90%, Ki67 10%.   05/09/2020 Cancer Staging   Staging form: Breast, AJCC 8th Edition - Clinical stage from 05/09/2020: Stage IA (cT1a, cN0, cM0, G1, ER+, PR+, HER2-) - Signed by Serena Croissant, MD on 05/09/2020 Stage prefix: Initial diagnosis   05/29/2020 Surgery   Left lumpectomy ([facility]): IDC with DCIS, 0.9cm, grade 2, clear margins, 3 left axillary lymph nodes negative for carcinoma.   06/13/2020 Cancer Staging   Staging form: Breast, AJCC 8th Edition - Pathologic: Stage IA (pT1b, pN0, cM0, G2, ER+, PR+, HER2-) - Signed by Loa Socks, NP on 06/13/2020 Histologic grading system: 3 grade system   06/13/2020 Oncotype testing   Oncotype score: 6: Distant risk of recurrence at 9 years: 3%   07/04/2020 - 08/01/2020 Radiation Therapy   Adjuvant radiation   02/20/2021 Genetic Testing   Negative genetic testing on the CancerNext-Expanded+RNAinsight panel.  PMS2 p.P404R VUS identified. The report date is 02/19/2021.  The CancerNext-Expanded gene panel offered by [facility]  and includes sequencing and rearrangement analysis for the following 77 genes: AIP, ALK, APC*, ATM*, AXIN2, BAP1, BARD1, BLM, BMPR1A, BRCA1*, BRCA2*, BRIP1*, CDC73, CDH1*, CDK4, CDKN1B, CDKN2A, CHEK2*, CTNNA1, DICER1, FANCC, FH, FLCN, GALNT12, KIF1B, LZTR1, MAX, MEN1, MET, MLH1*, MSH2*, MSH3, MSH6*, MUTYH*, NBN, NF1*, NF2, NTHL1, PALB2*, PHOX2B, PMS2*, POT1, PRKAR1A, PTCH1, PTEN*, RAD51C*, RAD51D*, RB1, RECQL, RET, SDHA, SDHAF2, SDHB, SDHC, SDHD, SMAD4, SMARCA4, SMARCB1, SMARCE1, STK11, SUFU, TMEM127, TP53*, TSC1, TSC2, VHL and XRCC2 (sequencing and deletion/duplication); EGFR, EGLN1, HOXB13, KIT, MITF, PDGFRA, POLD1, and POLE (sequencing only); EPCAM and GREM1 (deletion/duplication only). DNA and RNA analyses performed for * genes.     CHIEF COMPLIANT:   Discussed the use of AI scribe software for clinical note transcription with the patient, who gave verbal consent to proceed.  History of Present Illness   The patient, with a history of breast cancer and osteoporosis, presents for a routine follow-up. She has been on an unspecified medication for breast cancer for two years, starting in June 2022. She was last seen in March and was advised to schedule appointments every six months, but she was unaware of this and decided to return now for safety. She plans to schedule her next appointment one year from now.  Regarding her osteoporosis, she had a bone density test in 2022 and another one last year, which she paid for out-of-pocket as her insurance would not cover annual tests. She has been taking vitamin K2, calcium, and osteoimmune D, and has also been walking regularly. Her bone density improved, with her hip score improving from -1.7 to -1.4 and her spine score improving from -2 to -1.9. She expresses a desire to avoid  taking Fosamax.  The patient also mentions occasional tenderness in her breast area when pressed, but no other discomfort. She expresses a desire for a breast exam during this visit.          ALLERGIES:  has No Known Allergies.  MEDICATIONS:  Current Outpatient Medications  Medication Sig Dispense Refill   anastrozole (ARIMIDEX) 1 MG tablet Take 1 tablet (1 mg total) by mouth daily. 90 tablet 3   cholecalciferol 25 MCG (1000 UT) tablet Take by mouth.     Menatetrenone (VITAMIN K2) 100 MCG TABS      tretinoin (RETIN-A) 0.05 % cream Apply 1 application topically at bedtime.     XIIDRA 5 % SOLN Place 1 drop into both eyes in the morning.     No current facility-administered medications for this visit.    PHYSICAL EXAMINATION: ECOG PERFORMANCE STATUS: 1 - Symptomatic but completely ambulatory  Vitals:   11/17/22 1344  BP: (!) 157/71  Pulse: 68  Resp: 18  SpO2: 100%   Filed Weights   11/17/22 1343 11/17/22 1344  Weight: 124 lb (56.2 kg) 124 lb (56.2 kg)    Physical Exam          (exam performed in the presence of a chaperone)  LABORATORY DATA:  I have reviewed the data as listed    Latest Ref Rng & Units 05/23/2020    2:00 PM 05/09/2020   12:22 PM 03/13/2016    8:15 AM  CMP  Glucose 70 - 99 mg/dL 85  95  69   BUN 6 - 20 mg/dL 17  18  20    Creatinine 0.44 - 1.00 mg/dL 6.21  3.08  6.57   Sodium 135 - 145 mmol/L 139  142  140   Potassium 3.5 - 5.1 mmol/L 3.9  3.9  4.3   Chloride 98 - 111 mmol/L 105  106  102   CO2 22 - 32 mmol/L 26  27  25    Calcium 8.9 - 10.3 mg/dL 9.5  9.5  9.0   Total Protein 6.5 - 8.1 g/dL 6.7  7.1  6.6   Total Bilirubin 0.3 - 1.2 mg/dL 1.8  1.5  1.6   Alkaline Phos 38 - 126 U/L 62  74  61   AST 15 - 41 U/L 22  18  19    ALT 0 - 44 U/L 16  15  13      Lab Results  Component Value Date   WBC 6.1 05/23/2020   HGB 14.6 05/23/2020   HCT 44.2 05/23/2020   MCV 91.1 05/23/2020   PLT 271 05/23/2020   NEUTROABS 4.2 05/23/2020    ASSESSMENT & PLAN:  Malignant neoplasm of lower-outer quadrant of left breast of female, estrogen receptor positive (HCC) 05/04/2020:Screening mammogram showed a left breast asymmetry. Diagnostic  mammogram and US showed a 0.3cm mass at the 6 o'clock position and no left axillary adenopathy. Biopsy showed invasive and in situ ductal carcinoma, grade 2, HER-2 equivocal by IHC, negative by FISH (ratio 1.27), ER+ 95%, PR+ 90%, Ki67 10%   05/29/20: Left lumpectomy ([facility]): IDC with DCIS, 0.9cm, grade 2, clear margins, 3 left axillary lymph nodes negative for carcinoma.   Treatment Plan: 1. Oncotype Dx: 6 (distant recurrence at 9 years: 3%)  2. Adj XRT started 07/05/2020-08/01/2020 3. Adj Anti estrogen therapy with Anastrozole started June 2022   Anastrozole toxicities: None   Breast Cancer Surveillance: 1. Breast Exam: 11/17/2022: Benign 2. Mammograms: 04/29/2022: Benign breast density category B  Previous bone density 2022: T score -2: I would like to recheck her bone density in the next month to see how the anastrozole is affecting her bones.   Return to clinic in 1 year for follow-up to alternate with mammograms ------------------------------------- Assessment and Plan    Breast Health No reported pain or discomfort. Mild tenderness on palpation in one area. -Performed a breast exam today.  Osteopenia Improvement in bone density from -1.7 to -1.4 in the hips and -2 to -1.9 in the spine. Patient is on Osteo MD, Vitamin K2, and calcium, and has been walking regularly. -Continue current regimen. -Next bone density scan due in June 2025.  General Health Maintenance -Continue regular mammograms at [facility].          Orders Placed This Encounter  Procedures   DG Bone Density    Standing Status:   Future    Standing Expiration Date:   11/17/2023    Order Specific Question:   Reason for Exam (SYMPTOM  OR DIAGNOSIS REQUIRED)    Answer:   post menopausal    Order Specific Question:   Preferred imaging location?    Answer:   [facility]    Order Specific Question:   Release to patient    Answer:   Immediate   [employer] Only [facility] Managed)    Select as  applicable. If patient is on or planning to receive immunotherapy, select drug: Not on Immunotherapy If "Other or Multiple", Write down drug name:  Do not Delete Below This Line   ==========Department Information========== ID: [id number] Department:[facility] [address] [place] [address] Dept: [phone] Dept Fax: [phone]    Order Specific Question:   Surveillance Program draw frequency:    Answer:   Every 3 Months    Order Specific Question:   Surveillance Program draw count:    Answer:   4    Order Specific Question:   Cancer type:    Answer:   Breast    Order Specific Question:   Stage of diagnosis:    Answer:   I    Order Specific Question:   History of recurrence?    Answer:   No    Order Specific Question:   Current disease status:    Answer:   No evidence of disease    Order Specific Question:   Date of surgery (MM/DD/YYYY):    Answer:   04/30/2020    Order Specific Question:   Tissue collection date (MM/DD/YYYY):    Answer:   04/30/2020    Order Specific Question:   Is the patient receiving or planning to receive immunotherapy?    Answer:   No    Order Specific Question:   Patient status:    Answer:   Outpatient    Order Specific Question:   Most recent progress/clinical note attached?    Answer:   Yes    Order Specific Question:   Pathology report attached?    Answer:   Yes    Order Specific Question:   By placing this electronic order I confirm the testing ordered herein is medically necessary and this patient has been informed of the details of the genetic test(s) ordered, including the risks, benefits, and alternatives, and has consented to testing.    Answer:   Yes    Order Specific Question:   What type of billing?    Answer:   Automatic Data  Order Specific Question:   CC Results    Answer:   Serena Croissant [[id number]]   The patient has a good understanding of the overall  plan. she agrees with it. she will call with any problems that may develop before the next visit here. Total time spent: 30 mins including face to face time and time spent for planning, charting and co-ordination of care   Tamsen Meek, MD 11/17/22

## 2022-11-18 ENCOUNTER — Telehealth: Payer: Self-pay

## 2022-11-18 NOTE — Telephone Encounter (Signed)
Per md orders entered for [employer]. Requisition and all supported documents faxed to [phone]. Faxed confirmation was received.  

## 2022-11-24 ENCOUNTER — Telehealth: Payer: Self-pay

## 2022-11-24 NOTE — Telephone Encounter (Signed)
Pt placed call to our office to ask questions about [employer] testing. She states she received a call from [employer] rep. All questions were answered during a 17 minute call. She knows to call with any further questions. Additional path report faxed to Corliss Parish with [employer] at [phone]. Fax conf. Received.

## 2022-11-26 ENCOUNTER — Other Ambulatory Visit: Payer: Self-pay | Admitting: *Deleted

## 2022-11-26 NOTE — Progress Notes (Signed)
Received call from pt stating she does not want to undergo [employer] testing at this time.  Testing canceled.

## 2022-12-15 ENCOUNTER — Ambulatory Visit: Payer: BC Managed Care – PPO

## 2022-12-24 ENCOUNTER — Other Ambulatory Visit: Payer: Self-pay | Admitting: Hematology and Oncology

## 2022-12-24 DIAGNOSIS — Z9889 Other specified postprocedural states: Secondary | ICD-10-CM

## 2022-12-29 ENCOUNTER — Ambulatory Visit: Payer: BC Managed Care – PPO | Attending: Surgery

## 2022-12-29 VITALS — Wt 123.1 lb

## 2022-12-29 DIAGNOSIS — Z483 Aftercare following surgery for neoplasm: Secondary | ICD-10-CM | POA: Insufficient documentation

## 2022-12-29 NOTE — Therapy (Signed)
OUTPATIENT PHYSICAL THERAPY SOZO SCREENING NOTE   Patient Name: [name] MRN: [id number] DOB:[birth year], 62 y.o., female Today's Date: 12/29/2022  PCP: Deloris Ping, MD REFERRING PROVIDER: Harriette Bouillon, MD   PT End of Session - 12/29/22 1054     Visit Number 8   # unchanged due to screen only   PT Start Time 1052    PT Stop Time 1056    PT Time Calculation (min) 4 min    Activity Tolerance Patient tolerated treatment well    Behavior During Therapy [facility] for tasks assessed/performed             Past Medical History:  Diagnosis Date   Abnormal Pap smear    ASCUS/LGSIL   Breast cancer (HCC) 05/2020   left breast   Chronic kidney disease    horse shoe shaped kidneys   Family history of bladder cancer    Family history of breast cancer    Family history of colon cancer    Family history of ovarian cancer    Gilbert's syndrome    pt denies problems   History of kidney stones    Infertility    Seen by RE @ [facility]   Osteopenia    PONV (postoperative nausea and vomiting)    Vaginitis, atrophic    Past Surgical History:  Procedure Laterality Date   BREAST BIOPSY Left    BREAST LUMPECTOMY Left 05/29/2020   BREAST LUMPECTOMY WITH RADIOACTIVE SEED AND SENTINEL LYMPH NODE BIOPSY Left 05/29/2020   Procedure: LEFT BREAST LUMPECTOMY WITH RADIOACTIVE SEED AND LEFT SENTINEL LYMPH NODE BIOPSY;  Surgeon: Harriette Bouillon, MD;  Location: [facility] OR;  Service: General;  Laterality: Left;  P   CESAREAN SECTION  12/17/1993   [name]   DIAGNOSTIC LAPAROSCOPY     HYSTEROSCOPY WITH D & C N/A 03/09/2014   Procedure: DILATATION AND CURETTAGE /HYSTEROSCOPY;  Surgeon: Allie Bossier, MD;  Location: [facility] ORS;  Service: Gynecology;  Laterality: N/A;   kidney stone removal  2010   monalisa     Patient Active Problem List   Diagnosis Date Noted   Genetic testing 02/20/2021   Family history of breast cancer 01/30/2021   Family history of colon cancer 01/30/2021   Family history  of ovarian cancer 01/30/2021   Family history of bladder cancer 01/30/2021   Decreased functional activity tolerance 12/26/2020   Malignant neoplasm of lower-outer quadrant of left breast of female, estrogen receptor positive (HCC) 05/04/2020   Complex renal cyst 12/10/2016   Hemorrhoids, internal 08/11/2016   History of kidney stones 06/10/2016   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 She had left lumpectomy and sentinel node biopsy (3 negative nodes) on 05/29/2020. It is ER/PR positive and HER2 negative with a Ki67 of 10%.  PRECAUTIONS: left UE Lymphedema risk, None  SUBJECTIVE: Pt returns for her first 6 month L-Dex screen.   PAIN:  Are you having pain? No  SOZO SCREENING: Patient was assessed today using the SOZO machine to determine the lymphedema index score. This was compared to her baseline score. It was determined that she is within the recommended range when compared to her baseline and no further action is  needed at this time. She will continue SOZO screenings. These are done every 3 months for 2 years post operatively followed by every 6 months for 2 years, and then annually.    L-DEX FLOWSHEETS - 12/29/22 1000       L-DEX LYMPHEDEMA SCREENING   Measurement Type Unilateral    L-DEX MEASUREMENT EXTREMITY Upper Extremity    POSITION  Standing    DOMINANT SIDE Right    At Risk Side Left    BASELINE SCORE (UNILATERAL) -4.1    L-DEX SCORE (UNILATERAL) -2.8    VALUE CHANGE (UNILAT) 1.3            P: Cont 6 month screens next.   Hermenia Bers, PTA 12/29/2022, 10:54 AM

## 2023-04-27 ENCOUNTER — Ambulatory Visit: Payer: BC Managed Care – PPO | Admitting: Obstetrics and Gynecology

## 2023-05-04 ENCOUNTER — Ambulatory Visit
Admission: RE | Admit: 2023-05-04 | Discharge: 2023-05-04 | Disposition: A | Discharge status: Home / Self Care | Payer: BC Managed Care – PPO | Source: Ambulatory Visit | Attending: Hematology and Oncology

## 2023-05-04 DIAGNOSIS — Z9889 Other specified postprocedural states: Secondary | ICD-10-CM

## 2023-05-14 ENCOUNTER — Ambulatory Visit: Payer: BC Managed Care – PPO | Admitting: Obstetrics and Gynecology

## 2023-05-16 ENCOUNTER — Other Ambulatory Visit: Payer: Self-pay | Admitting: Hematology and Oncology

## 2023-05-18 NOTE — Progress Notes (Unsigned)
   ANNUAL EXAM Patient name: [name] MRN [id number]  Date of birth: 1960/12/22 Chief Complaint:   No chief complaint on file.  History of Present Illness:   [name] is a 63 y.o. G2P1011 female being seen today for a routine annual exam.   Current complaints: None  2023: vaginal dryness.  She has done PFPT and vaginal lubricants as well as dilators which do help when she does them regularly.   No LMP recorded. Patient is postmenopausal.  Last pap: 04/2021. Results were: NILM w/ HRHPV negative. H/O abnormal pap: no Last MXR: Wnl 05/3023 -- 2022 was diagnosed with left breast cancer   Review of Systems:   Pertinent items are noted in HPI Denies any headaches, blurred vision, fatigue, shortness of breath, chest pain, abdominal pain, abnormal vaginal discharge/itching/odor/irritation, problems with periods, bowel movements, urination, or intercourse unless otherwise stated above.  Pertinent History Reviewed:  Reviewed past medical,surgical, social and family history.  Reviewed problem list, medications and allergies. Physical Assessment:   There were no vitals filed for this visit. There is no height or weight on file to calculate BMI.   Physical Examination:  General appearance - well appearing, and in no distress Mental status - alert, oriented to person, place, and time Psych:  She has a normal mood and affect Skin - warm and dry, normal color, no suspicious lesions noted Chest - effort normal Heart - normal rate  Breasts - breasts appear normal, no suspicious masses, no skin or nipple changes or axillary nodes Abdomen - soft, nontender, nondistended, no masses or organomegaly Pelvic -  VULVA: normal appearing vulva with no masses, tenderness or lesions  VAGINA: normal appearing vagina with normal color and discharge, no lesions  CERVIX: normal appearing cervix without discharge or lesions, no CMT UTERUS: uterus is felt to be normal size, shape, consistency and  nontender  ADNEXA: No adnexal masses or tenderness noted. Extremities:  No swelling or varicosities noted  Chaperone present for exam  No results found for this or any previous visit (from the past 24 hours).  Assessment & Plan:  Diagnoses and all orders for this visit:  Encounter for annual routine gynecological examination  - Cervical cancer screening: Discussed guidelines. Pap with HPV normal 04/2021 - Breast Health: Encouraged self breast awareness/SBE. Discussed limits of clinical breast exam for detecting breast cancer. Discussed importance of annual MXR. Up to date.  - Climacteric/Sexual health: Reviewed typical and atypical symptoms of menopause/peri-menopause. Discussed PMB and to call if any amount of spotting.  - Colonoscopy:  2014  *** - F/U 12 months and prn     No orders of the defined types were placed in this encounter.   Meds: No orders of the defined types were placed in this encounter.   Follow-up: No follow-ups on file.  Milas Hock, MD 05/18/2023 12:38 PM

## 2023-05-21 ENCOUNTER — Ambulatory Visit (INDEPENDENT_AMBULATORY_CARE_PROVIDER_SITE_OTHER): Payer: BC Managed Care – PPO | Admitting: Obstetrics and Gynecology

## 2023-05-21 ENCOUNTER — Encounter: Payer: Self-pay | Admitting: Obstetrics and Gynecology

## 2023-05-21 VITALS — BP 159/81 | HR 66 | Resp 16 | Ht 62.0 in | Wt 117.0 lb

## 2023-05-21 DIAGNOSIS — Z01419 Encounter for gynecological examination (general) (routine) without abnormal findings: Secondary | ICD-10-CM | POA: Diagnosis not present

## 2023-06-15 ENCOUNTER — Ambulatory Visit: Payer: BC Managed Care – PPO

## 2023-06-29 ENCOUNTER — Ambulatory Visit: Payer: BC Managed Care – PPO | Attending: Surgery

## 2023-06-29 VITALS — Wt 119.0 lb

## 2023-06-29 DIAGNOSIS — Z483 Aftercare following surgery for neoplasm: Secondary | ICD-10-CM | POA: Insufficient documentation

## 2023-06-29 NOTE — Therapy (Signed)
 OUTPATIENT PHYSICAL THERAPY SOZO SCREENING NOTE   Patient Name: [name] MRN: [id number] DOB:[birth year], 63 y.o., female Today's Date: 06/29/2023  PCP: Lorenso Romance, MD REFERRING PROVIDER: Sim Dryer, MD   PT End of Session - 06/29/23 1506     Visit Number 8   # unchanged due to screen only   PT Start Time 1504    PT Stop Time 1508    PT Time Calculation (min) 4 min    Activity Tolerance Patient tolerated treatment well    Behavior During Therapy [facility] for tasks assessed/performed             Past Medical History:  Diagnosis Date   Abnormal Pap smear    ASCUS/LGSIL   Breast cancer (HCC) 05/2020   left breast   Chronic kidney disease    horse shoe shaped kidneys   Family history of bladder cancer    Family history of breast cancer    Family history of colon cancer    Family history of ovarian cancer    Gilbert's syndrome    pt denies problems   History of kidney stones    Infertility    Seen by RE @ [facility]   Osteopenia    PONV (postoperative nausea and vomiting)    Vaginitis, atrophic    Past Surgical History:  Procedure Laterality Date   BREAST BIOPSY Left    BREAST LUMPECTOMY Left 05/29/2020   BREAST LUMPECTOMY WITH RADIOACTIVE SEED AND SENTINEL LYMPH NODE BIOPSY Left 05/29/2020   Procedure: LEFT BREAST LUMPECTOMY WITH RADIOACTIVE SEED AND LEFT SENTINEL LYMPH NODE BIOPSY;  Surgeon: Sim Dryer, MD;  Location: [facility] OR;  Service: General;  Laterality: Left;  P   CESAREAN SECTION  12/17/1993   [name]   DIAGNOSTIC LAPAROSCOPY     HYSTEROSCOPY WITH D & C N/A 03/09/2014   Procedure: DILATATION AND CURETTAGE /HYSTEROSCOPY;  Surgeon: Ana Balling, MD;  Location: [facility] ORS;  Service: Gynecology;  Laterality: N/A;   kidney stone removal  2010   monalisa     Patient Active Problem List   Diagnosis Date Noted   Genetic testing 02/20/2021   Family history of breast cancer 01/30/2021   Family history of colon cancer 01/30/2021   Family history  of ovarian cancer 01/30/2021   Family history of bladder cancer 01/30/2021   Decreased functional activity tolerance 12/26/2020   Malignant neoplasm of lower-outer quadrant of left breast of female, estrogen receptor positive (HCC) 05/04/2020   Complex renal cyst 12/10/2016   Hemorrhoids, internal 08/11/2016   History of kidney stones 06/10/2016   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 She had left lumpectomy and sentinel node biopsy (3 negative nodes) on 05/29/2020. It is ER/PR positive and HER2 negative with a Ki67 of 10%.  PRECAUTIONS: left UE Lymphedema risk, None  SUBJECTIVE: Pt returns for her 6 month L-Dex screen.   PAIN:  Are you having pain? No  SOZO SCREENING: Patient was assessed today using the SOZO machine to determine the lymphedema index score. This was compared to her baseline score. It was determined that she is within the recommended range when compared to her baseline and no further action is needed  at this time. She will continue SOZO screenings. These are done every 3 months for 2 years post operatively followed by every 6 months for 2 years, and then annually.    L-DEX FLOWSHEETS - 06/29/23 1500       L-DEX LYMPHEDEMA SCREENING   Measurement Type Unilateral    L-DEX MEASUREMENT EXTREMITY Upper Extremity    POSITION  Standing    DOMINANT SIDE Right    At Risk Side Left    BASELINE SCORE (UNILATERAL) -4.1    L-DEX SCORE (UNILATERAL) -1.7    VALUE CHANGE (UNILAT) 2.4            P: Cont 6 month screens next.   [name], PTA 06/29/2023, 3:07 PM

## 2023-08-12 ENCOUNTER — Ambulatory Visit

## 2023-08-12 DIAGNOSIS — Z13820 Encounter for screening for osteoporosis: Secondary | ICD-10-CM

## 2023-08-12 DIAGNOSIS — Z780 Asymptomatic menopausal state: Secondary | ICD-10-CM

## 2023-11-17 ENCOUNTER — Inpatient Hospital Stay: Payer: BC Managed Care – PPO | Attending: Hematology and Oncology | Admitting: Hematology and Oncology

## 2023-11-17 VITALS — BP 150/70 | HR 71 | Temp 97.6°F | Resp 18 | Ht 61.0 in | Wt 121.2 lb

## 2023-11-17 DIAGNOSIS — Z79811 Long term (current) use of aromatase inhibitors: Secondary | ICD-10-CM | POA: Insufficient documentation

## 2023-11-17 DIAGNOSIS — Z17411 Hormone receptor positive with human epidermal growth factor receptor 2 negative status: Secondary | ICD-10-CM | POA: Insufficient documentation

## 2023-11-17 DIAGNOSIS — M810 Age-related osteoporosis without current pathological fracture: Secondary | ICD-10-CM | POA: Insufficient documentation

## 2023-11-17 DIAGNOSIS — C50512 Malignant neoplasm of lower-outer quadrant of left female breast: Secondary | ICD-10-CM | POA: Insufficient documentation

## 2023-11-17 DIAGNOSIS — Z170 Estrogen receptor positive status [ER+]: Secondary | ICD-10-CM

## 2023-11-17 NOTE — Assessment & Plan Note (Signed)
 05/04/2020:Screening mammogram showed a left breast asymmetry. Diagnostic mammogram and US  showed a 0.3cm mass at the 6 o'clock position and no left axillary adenopathy. Biopsy showed invasive and in situ ductal carcinoma, grade 2, HER-2 equivocal by IHC, negative by FISH (ratio 1.27), ER+ 95%, PR+ 90%, Ki67 10%   05/29/20: Left lumpectomy ([facility]): IDC with DCIS, 0.9cm, grade 2, clear margins, 3 left axillary lymph nodes negative for carcinoma.   Treatment Plan: 1. Oncotype Dx: 6 (distant recurrence at 9 years: 3%)  2. Adj XRT started 07/05/2020-08/01/2020 3. Adj Anti estrogen therapy with Anastrozole  started June 2022   Anastrozole  toxicities: None   Breast Cancer Surveillance: 1. Breast Exam: 11/17/2023: Benign 2. Mammograms: 05/04/2023: Benign breast density category B   Previous bone density 2022: T score -2:  Bone density 08/12/2023: T-score -2   Return to clinic in 1 year for follow-up to alternate with mammograms

## 2023-11-17 NOTE — Progress Notes (Signed)
 Patient Care Team: Clarance Joen HERO, MD as PCP - General (Family Medicine) Vanderbilt Ned, MD as Consulting Physician (General Surgery) Odean Potts, MD as Consulting Physician (Hematology and Oncology) Izell Domino, MD as Attending Physician (Radiation Oncology)  DIAGNOSIS:  Encounter Diagnosis  Name Primary?   Malignant neoplasm of lower-outer quadrant of left breast of female, estrogen receptor positive (HCC) Yes    SUMMARY OF ONCOLOGIC HISTORY: Oncology History  Malignant neoplasm of lower-outer quadrant of left breast of female, estrogen receptor positive (HCC)  05/04/2020 Initial Diagnosis   Screening mammogram showed a left breast asymmetry. Diagnostic mammogram and US  showed a 0.3cm mass at the 6 o'clock position and no left axillary adenopathy. Biopsy showed invasive and in situ ductal carcinoma, grade 2, HER-2 equivocal by IHC, negative by FISH (ratio 1.27), ER+ 95%, PR+ 90%, Ki67 10%.   05/09/2020 Cancer Staging   Staging form: Breast, AJCC 8th Edition - Clinical stage from 05/09/2020: Stage IA (cT1a, cN0, cM0, G1, ER+, PR+, HER2-) - Signed by Odean Potts, MD on 05/09/2020 Stage prefix: Initial diagnosis   05/29/2020 Surgery   Left lumpectomy ([facility]): IDC with DCIS, 0.9cm, grade 2, clear margins, 3 left axillary lymph nodes negative for carcinoma.   06/13/2020 Cancer Staging   Staging form: Breast, AJCC 8th Edition - Pathologic: Stage IA (pT1b, pN0, cM0, G2, ER+, PR+, HER2-) - Signed by Crawford Morna Pickle, NP on 06/13/2020 Histologic grading system: 3 grade system   06/13/2020 Oncotype testing   Oncotype score: 6: Distant risk of recurrence at 9 years: 3%   07/04/2020 - 08/01/2020 Radiation Therapy   Adjuvant radiation   02/20/2021 Genetic Testing   Negative genetic testing on the CancerNext-Expanded+RNAinsight panel.  PMS2 [id number].[id number] VUS identified. The report date is 02/19/2021.  The CancerNext-Expanded gene panel offered by [facility] and includes  sequencing and rearrangement analysis for the following 77 genes: AIP, ALK, APC*, ATM*, AXIN2, BAP1, BARD1, BLM, BMPR1A, BRCA1*, BRCA2*, BRIP1*, CDC73, CDH1*, CDK4, CDKN1B, CDKN2A, CHEK2*, CTNNA1, DICER1, FANCC, FH, FLCN, GALNT12, KIF1B, LZTR1, MAX, MEN1, MET, MLH1*, MSH2*, MSH3, MSH6*, MUTYH*, NBN, NF1*, NF2, NTHL1, PALB2*, PHOX2B, PMS2*, POT1, PRKAR1A, PTCH1, PTEN*, RAD51C*, RAD51D*, RB1, RECQL, RET, SDHA, SDHAF2, SDHB, SDHC, SDHD, SMAD4, SMARCA4, SMARCB1, SMARCE1, STK11, SUFU, TMEM127, TP53*, TSC1, TSC2, VHL and XRCC2 (sequencing and deletion/duplication); EGFR, EGLN1, HOXB13, KIT, MITF, PDGFRA, POLD1, and POLE (sequencing only); EPCAM and GREM1 (deletion/duplication only). DNA and RNA analyses performed for * genes.     CHIEF COMPLIANT: Follow-up on anastrozole  therapy  HISTORY OF PRESENT ILLNESS: History of Present Illness [name] [name] is a 63 year old female who presents for follow-up regarding her ongoing treatment with anastrozole .  She has been on anastrozole  since June 2022, continuing with a daily dose of 1 mg. Mammograms performed in March are reported as good. Bone density results from June are consistent with those from 2022. She maintains bone health with vitamin D  supplements, diet, and regular exercise, without taking calcium supplements.     ALLERGIES:  has no known allergies.  MEDICATIONS:  Current Outpatient Medications  Medication Sig Dispense Refill   anastrozole  (ARIMIDEX ) 1 MG tablet Take 1 tablet (1 mg total) by mouth daily. 90 tablet 3   cholecalciferol 25 MCG (1000 UT) tablet Take by mouth.     Menatetrenone (VITAMIN K2) 100 MCG TABS      tretinoin (RETIN-A) 0.05 % cream Apply 1 application topically at bedtime.     XIIDRA 5 % SOLN Place 1 drop into both eyes in the  morning.     No current facility-administered medications for this visit.    PHYSICAL EXAMINATION: ECOG PERFORMANCE STATUS: 1 - Symptomatic but completely ambulatory  Vitals:   11/17/23  1341  BP: (!) 150/70  Pulse: 71  Resp: 18  Temp: 97.6 F (36.4 C)  SpO2: 100%   Filed Weights   11/17/23 1341  Weight: 121 lb 3.2 oz (55 kg)     LABORATORY DATA:  I have reviewed the data as listed    Latest Ref Rng & Units 05/23/2020    2:00 PM 05/09/2020   12:22 PM 03/13/2016    8:15 AM  CMP  Glucose 70 - 99 mg/dL 85  95  69   BUN 6 - 20 mg/dL 17  18  20    Creatinine 0.44 - 1.00 mg/dL 9.27  9.17  9.09   Sodium 135 - 145 mmol/L 139  142  140   Potassium 3.5 - 5.1 mmol/L 3.9  3.9  4.3   Chloride 98 - 111 mmol/L 105  106  102   CO2 22 - 32 mmol/L 26  27  25    Calcium 8.9 - 10.3 mg/dL 9.5  9.5  9.0   Total Protein 6.5 - 8.1 g/dL 6.7  7.1  6.6   Total Bilirubin 0.3 - 1.2 mg/dL 1.8  1.5  1.6   Alkaline Phos 38 - 126 U/L 62  74  61   AST 15 - 41 U/L 22  18  19    ALT 0 - 44 U/L 16  15  13      Lab Results  Component Value Date   WBC 6.1 05/23/2020   HGB 14.6 05/23/2020   HCT 44.2 05/23/2020   MCV 91.1 05/23/2020   PLT 271 05/23/2020   NEUTROABS 4.2 05/23/2020    ASSESSMENT & PLAN:  Malignant neoplasm of lower-outer quadrant of left breast of female, estrogen receptor positive (HCC) 05/04/2020:Screening mammogram showed a left breast asymmetry. Diagnostic mammogram and US  showed a 0.3cm mass at the 6 o'clock position and no left axillary adenopathy. Biopsy showed invasive and in situ ductal carcinoma, grade 2, HER-2 equivocal by IHC, negative by FISH (ratio 1.27), ER+ 95%, PR+ 90%, Ki67 10%   05/29/20: Left lumpectomy ([facility]): IDC with DCIS, 0.9cm, grade 2, clear margins, 3 left axillary lymph nodes negative for carcinoma.   Treatment Plan: 1. Oncotype Dx: 6 (distant recurrence at 9 years: 3%)  2. Adj XRT started 07/05/2020-08/01/2020 3. Adj Anti estrogen therapy with Anastrozole  started June 2022   Anastrozole  toxicities: None   Breast Cancer Surveillance: 1. Breast Exam: 11/17/2023: Benign 2. Mammograms: 05/04/2023: Benign breast density category B   Previous bone  density 2022: T score -2:  Bone density 08/12/2023: T-score -2   Return to clinic in 1 year for follow-up to alternate with mammograms ------------------------------------- Assessment and Plan Assessment & Plan Estrogen receptor positive malignant neoplasm of lower-outer quadrant of left breast On anastrozole  since June 2022. Mammograms in March showed no recurrence or progression. - Continue anastrozole  1 mg orally daily. - Perform breast exam today.  Osteoporosis Bone density well-managed since June 2022. Advised dietary calcium, vitamin D  supplementation, and exercise. - Continue cholecalciferol 25 mcg orally. - Encourage dietary calcium intake. - Maintain regular walking and exercise routine.      No orders of the defined types were placed in this encounter.  The patient has a good understanding of the overall plan. she agrees with it. she will call with any problems that may develop  before the next visit here. Total time spent: 30 mins including face to face time and time spent for planning, charting and co-ordination of care   Naomi MARLA Chad, MD 11/17/23

## 2024-01-04 ENCOUNTER — Ambulatory Visit: Attending: Surgery

## 2024-01-04 VITALS — Wt 115.5 lb

## 2024-01-04 DIAGNOSIS — Z483 Aftercare following surgery for neoplasm: Secondary | ICD-10-CM | POA: Insufficient documentation

## 2024-01-04 NOTE — Therapy (Signed)
 OUTPATIENT PHYSICAL THERAPY SOZO SCREENING NOTE   Patient Name: [name] MRN: [id number] DOB:10/31/1960, 63 y.o., female Today's Date: 01/04/2024  PCP: Clarance Joen HERO, MD REFERRING PROVIDER: Vanderbilt Ned, MD   PT End of Session - 01/04/24 0957     Visit Number 8   # unchanged due to screen only   PT Start Time 0955    PT Stop Time 0959    PT Time Calculation (min) 4 min    Activity Tolerance Patient tolerated treatment well    Behavior During Therapy [facility] for tasks assessed/performed          Past Medical History:  Diagnosis Date   Abnormal Pap smear    ASCUS/LGSIL   Breast cancer (HCC) 05/2020   left breast   Chronic kidney disease    horse shoe shaped kidneys   Family history of bladder cancer    Family history of breast cancer    Family history of colon cancer    Family history of ovarian cancer    Gilbert's syndrome    pt denies problems   History of kidney stones    Infertility    Seen by RE @ [facility]   Osteopenia    PONV (postoperative nausea and vomiting)    Vaginitis, atrophic    Past Surgical History:  Procedure Laterality Date   BREAST BIOPSY Left    BREAST LUMPECTOMY Left 05/29/2020   BREAST LUMPECTOMY WITH RADIOACTIVE SEED AND SENTINEL LYMPH NODE BIOPSY Left 05/29/2020   Procedure: LEFT BREAST LUMPECTOMY WITH RADIOACTIVE SEED AND LEFT SENTINEL LYMPH NODE BIOPSY;  Surgeon: Vanderbilt Ned, MD;  Location: [facility] OR;  Service: General;  Laterality: Left;  P   CESAREAN SECTION  12/17/1993   [name]   DIAGNOSTIC LAPAROSCOPY     HYSTEROSCOPY WITH D & C N/A 03/09/2014   Procedure: DILATATION AND CURETTAGE /HYSTEROSCOPY;  Surgeon: Harland JAYSON Birkenhead, MD;  Location: [facility] ORS;  Service: Gynecology;  Laterality: N/A;   kidney stone removal  2010   monalisa     Patient Active Problem List   Diagnosis Date Noted   Genetic testing 02/20/2021   Family history of breast cancer 01/30/2021   Family history of colon cancer 01/30/2021   Family history of  ovarian cancer 01/30/2021   Family history of bladder cancer 01/30/2021   Decreased functional activity tolerance 12/26/2020   Malignant neoplasm of lower-outer quadrant of left breast of female, estrogen receptor positive (HCC) 05/04/2020   Complex renal cyst 12/10/2016   Hemorrhoids, internal 08/11/2016   History of kidney stones 06/10/2016   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 She had left lumpectomy and sentinel node biopsy (3 negative nodes) on 05/29/2020. It is ER/PR positive and HER2 negative with a Ki67 of 10%.  PRECAUTIONS: left UE Lymphedema risk, None  SUBJECTIVE: Pt returns for her 6 month L-Dex screen.   PAIN:  Are you having pain? No  SOZO SCREENING: Patient was assessed today using the SOZO machine to determine the lymphedema index score. This was compared to her baseline score. It was determined that she is within the recommended range when compared to her baseline and no further action is needed at this time.  She will continue SOZO screenings. These are done every 3 months for 2 years post operatively followed by every 6 months for 2 years, and then annually.    L-DEX FLOWSHEETS - 01/04/24 0900       L-DEX LYMPHEDEMA SCREENING   Measurement Type Unilateral    L-DEX MEASUREMENT EXTREMITY Upper Extremity    POSITION  Standing    DOMINANT SIDE Right    At Risk Side Left    BASELINE SCORE (UNILATERAL) -4.1    L-DEX SCORE (UNILATERAL) -3.5    VALUE CHANGE (UNILAT) 0.6         P: Cont 6 month screens one more time and then can transition to annual.   Aden Berwyn Caldron, PTA 01/04/2024, 9:59 AM

## 2024-01-11 ENCOUNTER — Ambulatory Visit

## 2024-03-16 ENCOUNTER — Encounter: Payer: Self-pay | Admitting: Hematology and Oncology

## 2024-03-16 ENCOUNTER — Other Ambulatory Visit: Payer: Self-pay | Admitting: Hematology and Oncology

## 2024-03-16 DIAGNOSIS — Z9889 Other specified postprocedural states: Secondary | ICD-10-CM

## 2024-05-04 ENCOUNTER — Encounter

## 2024-07-11 ENCOUNTER — Ambulatory Visit: Attending: Surgery

## 2024-11-16 ENCOUNTER — Ambulatory Visit: Admitting: Hematology and Oncology
